# Patient Record
Sex: Female | Born: 1985 | Race: Black or African American | Hispanic: No | Marital: Single | Smoking: Never smoker
Health system: Southern US, Community
[De-identification: ages and names within clinical notes are randomized; demographics above are authoritative.]

## PROBLEM LIST (undated history)

## (undated) DIAGNOSIS — D496 Neoplasm of unspecified behavior of brain: Secondary | ICD-10-CM

## (undated) DIAGNOSIS — N6452 Nipple discharge: Secondary | ICD-10-CM

## (undated) DIAGNOSIS — R2689 Other abnormalities of gait and mobility: Secondary | ICD-10-CM

## (undated) DIAGNOSIS — N12 Tubulo-interstitial nephritis, not specified as acute or chronic: Secondary | ICD-10-CM

## (undated) DIAGNOSIS — B20 Human immunodeficiency virus [HIV] disease: Secondary | ICD-10-CM

## (undated) DIAGNOSIS — C716 Malignant neoplasm of cerebellum: Secondary | ICD-10-CM

## (undated) DIAGNOSIS — N390 Urinary tract infection, site not specified: Secondary | ICD-10-CM

## (undated) DIAGNOSIS — R260 Ataxic gait: Secondary | ICD-10-CM

## (undated) HISTORY — DX: Malignant neoplasm of cerebellum: C71.6

## (undated) HISTORY — PX: TONSILLECTOMY AND ADENOIDECTOMY: SUR1326

---

## 2010-03-23 ENCOUNTER — Emergency Department (HOSPITAL_COMMUNITY)
Admission: EM | Admit: 2010-03-23 | Discharge: 2010-03-23 | Disposition: A | Discharge status: Home / Self Care | Payer: Self-pay | Attending: Emergency Medicine | Admitting: Emergency Medicine

## 2010-03-23 DIAGNOSIS — M549 Dorsalgia, unspecified: Secondary | ICD-10-CM | POA: Insufficient documentation

## 2010-03-23 DIAGNOSIS — R1110 Vomiting, unspecified: Secondary | ICD-10-CM | POA: Insufficient documentation

## 2010-03-23 DIAGNOSIS — R51 Headache: Secondary | ICD-10-CM | POA: Insufficient documentation

## 2010-03-23 DIAGNOSIS — R109 Unspecified abdominal pain: Secondary | ICD-10-CM | POA: Insufficient documentation

## 2010-03-23 DIAGNOSIS — N390 Urinary tract infection, site not specified: Secondary | ICD-10-CM | POA: Insufficient documentation

## 2010-03-23 LAB — URINALYSIS, ROUTINE W REFLEX MICROSCOPIC
Hgb urine dipstick: NEGATIVE
Ketones, ur: NEGATIVE mg/dL
Protein, ur: NEGATIVE mg/dL
Urine Glucose, Fasting: NEGATIVE mg/dL
pH: 7.5 (ref 5.0–8.0)

## 2010-03-23 LAB — URINE MICROSCOPIC-ADD ON

## 2010-03-23 LAB — PREGNANCY, URINE: Preg Test, Ur: NEGATIVE

## 2010-03-25 LAB — URINE CULTURE: Culture  Setup Time: 201202081412

## 2010-07-09 ENCOUNTER — Emergency Department (HOSPITAL_COMMUNITY)
Admission: EM | Admit: 2010-07-09 | Discharge: 2010-07-09 | Disposition: A | Discharge status: Home / Self Care | Payer: Self-pay | Attending: Emergency Medicine | Admitting: Emergency Medicine

## 2010-07-09 ENCOUNTER — Emergency Department (HOSPITAL_COMMUNITY): Payer: Self-pay

## 2010-07-09 DIAGNOSIS — N390 Urinary tract infection, site not specified: Secondary | ICD-10-CM | POA: Insufficient documentation

## 2010-07-09 DIAGNOSIS — O2390 Unspecified genitourinary tract infection in pregnancy, unspecified trimester: Secondary | ICD-10-CM | POA: Insufficient documentation

## 2010-07-09 DIAGNOSIS — R300 Dysuria: Secondary | ICD-10-CM | POA: Insufficient documentation

## 2010-07-09 LAB — URINALYSIS, ROUTINE W REFLEX MICROSCOPIC
Nitrite: NEGATIVE
Specific Gravity, Urine: 1.026 (ref 1.005–1.030)
Urobilinogen, UA: 1 mg/dL (ref 0.0–1.0)
pH: 6.5 (ref 5.0–8.0)

## 2010-07-09 LAB — URINE MICROSCOPIC-ADD ON

## 2010-07-12 LAB — URINE CULTURE

## 2010-07-13 ENCOUNTER — Emergency Department (HOSPITAL_COMMUNITY)
Admission: EM | Admit: 2010-07-13 | Discharge: 2010-07-14 | Disposition: A | Discharge status: Home / Self Care | Payer: Self-pay | Attending: Emergency Medicine | Admitting: Emergency Medicine

## 2010-07-13 DIAGNOSIS — N898 Other specified noninflammatory disorders of vagina: Secondary | ICD-10-CM | POA: Insufficient documentation

## 2010-07-13 DIAGNOSIS — O2390 Unspecified genitourinary tract infection in pregnancy, unspecified trimester: Secondary | ICD-10-CM | POA: Insufficient documentation

## 2010-07-13 DIAGNOSIS — N72 Inflammatory disease of cervix uteri: Secondary | ICD-10-CM | POA: Insufficient documentation

## 2010-07-14 LAB — WET PREP, GENITAL: Trich, Wet Prep: NONE SEEN

## 2010-08-30 LAB — ABO/RH: RH Type: POSITIVE

## 2010-08-30 LAB — RUBELLA ANTIBODY, IGM: Rubella: IMMUNE

## 2010-08-30 LAB — HIV ANTIBODY (ROUTINE TESTING W REFLEX): HIV: NONREACTIVE

## 2010-08-30 LAB — HEPATITIS B SURFACE ANTIGEN: Hepatitis B Surface Ag: NEGATIVE

## 2010-08-31 ENCOUNTER — Encounter (HOSPITAL_COMMUNITY): Payer: Self-pay | Admitting: Obstetrics and Gynecology

## 2010-08-31 ENCOUNTER — Inpatient Hospital Stay (HOSPITAL_COMMUNITY)
Admit: 2010-08-31 | Discharge: 2010-08-31 | Disposition: A | Discharge status: Home / Self Care | Payer: Medicaid Other | Source: Ambulatory Visit | Attending: Obstetrics | Admitting: Obstetrics

## 2010-08-31 ENCOUNTER — Other Ambulatory Visit: Payer: Self-pay | Admitting: Obstetrics & Gynecology

## 2010-08-31 DIAGNOSIS — N949 Unspecified condition associated with female genital organs and menstrual cycle: Secondary | ICD-10-CM | POA: Insufficient documentation

## 2010-08-31 DIAGNOSIS — R109 Unspecified abdominal pain: Secondary | ICD-10-CM

## 2010-08-31 DIAGNOSIS — O99891 Other specified diseases and conditions complicating pregnancy: Secondary | ICD-10-CM | POA: Insufficient documentation

## 2010-08-31 DIAGNOSIS — Z8751 Personal history of pre-term labor: Secondary | ICD-10-CM

## 2010-08-31 DIAGNOSIS — O9989 Other specified diseases and conditions complicating pregnancy, childbirth and the puerperium: Secondary | ICD-10-CM

## 2010-08-31 LAB — URINALYSIS, ROUTINE W REFLEX MICROSCOPIC
Bilirubin Urine: NEGATIVE
Glucose, UA: NEGATIVE mg/dL
Hgb urine dipstick: NEGATIVE
Ketones, ur: NEGATIVE mg/dL
pH: 7 (ref 5.0–8.0)

## 2010-08-31 LAB — URINE MICROSCOPIC-ADD ON

## 2010-08-31 MED ORDER — ACETAMINOPHEN 325 MG PO TABS
650.0000 mg | ORAL_TABLET | Freq: Once | ORAL | Status: AC
  Administered 2010-08-31: 650 mg via ORAL
  Filled 2010-08-31: qty 2

## 2010-08-31 NOTE — ED Provider Notes (Signed)
History     Chief Complaint  Patient presents with  . Back Pain  . Abdominal Pain   HPI Having lower abdominal pain, vaginal pain and back pain.  Was seen in the office yesterday.  Was given Macrobid for UTI suppression, but has not filled prescription yet.  Went to work and worked 5 pm to 7 am today.  Pain has worsened today so she came for evaluation. OB History    Grav Para Term Preterm Abortions TAB SAB Ect Mult Living   3 2 1 1      2       Past Medical History  Diagnosis Date  . UTI (urinary tract infection)   . No pertinent past medical history     Past Surgical History  Procedure Date  . Tonsillectomy     Family History  Problem Relation Age of Onset  . Diabetes Mother   . Cancer Mother   . Thyroid disease Mother   . Hypertension Father   . Cancer Father   . Thyroid disease Father   . Stroke Father     History  Substance Use Topics  . Smoking status: Never Smoker   . Smokeless tobacco: Not on file  . Alcohol Use: No    Allergies:  Allergies  Allergen Reactions  . Penicillins Hives and Itching    Prescriptions prior to admission  Medication Sig Dispense Refill  . prenatal vitamin w/FE, FA (PRENATAL 1 + 1) 27-1 MG TABS Take 1 tablet by mouth daily.        . nitrofurantoin (MACRODANTIN) 100 MG capsule Take 100 mg by mouth daily.          ROS Physical Exam   Blood pressure 105/57, pulse 97, temperature 98.4 F (36.9 C), temperature source Oral, resp. rate 20, height 5\' 2"  (1.575 m), weight 152 lb (68.947 kg), last menstrual period 03/04/2010.  Physical Exam  Nursing note and vitals reviewed. Constitutional: She is oriented to person, place, and time. She appears well-developed and well-nourished.  HENT:  Head: Normocephalic.  Eyes: EOM are normal.  Neck: Neck supple.  GI: Soft. There is no tenderness. There is no rebound and no guarding.       One contraction seen on monitor strip during visit.  FHT baseline 140.  Genitourinary:   Bimanual exam - cervix long, thick,1 cm, firm - unchanged from exam in office yesterday  Musculoskeletal: Normal range of motion.  Neurological: She is alert and oriented to person, place, and time.  Skin: Skin is warm and dry.  Psychiatric: She has a normal mood and affect.    MAU Course  Procedures Results for orders placed during the hospital encounter of 08/31/10 (from the past 24 hour(s))  URINALYSIS, ROUTINE W REFLEX MICROSCOPIC     Status: Abnormal   Collection Time   08/31/10 12:14 PM      Component Value Range   Color, Urine YELLOW  YELLOW    Appearance CLEAR  CLEAR    Specific Gravity, Urine 1.025  1.005 - 1.030    pH 7.0  5.0 - 8.0    Glucose, UA NEGATIVE  NEGATIVE (mg/dL)   Hgb urine dipstick NEGATIVE  NEGATIVE    Bilirubin Urine NEGATIVE  NEGATIVE    Ketones, ur NEGATIVE  NEGATIVE (mg/dL)   Protein, ur NEGATIVE  NEGATIVE (mg/dL)   Urobilinogen, UA 0.2  0.0 - 1.0 (mg/dL)   Nitrite NEGATIVE  NEGATIVE    Leukocytes, UA TRACE (*) NEGATIVE   URINE MICROSCOPIC-ADD  ON     Status: Normal   Collection Time   08/31/10 12:14 PM      Component Value Range   Squamous Epithelial / LPF RARE  RARE    WBC, UA 7-10  <3 (WBC/hpf)   Bacteria, UA RARE  RARE    MDM Reviewed plan of care with Dr. Clearance Coots  Assessment and Plan  Abdominal pain in pregnancy No UTI No cervical change  Plan: Tylenol 325 mg 2 tablets by mouth every 4 hours if needed for pain. Drink at least 8 8-oz glasses of water every day.  Keep the scheduled appointment you have in the office.  Call your doctor if you have questions or concerns.  Get your prescription filled for the Macrobid given to you in the office and take as directed beginning today. BURLESON,TERRI 08/31/2010, 12:38 PM

## 2010-08-31 NOTE — Progress Notes (Signed)
Pt presents to MAU with complaints of back pain, abdominal pain, and vaginal pressure. 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 Pt has a history as stated by her of a pre term delivery with her last pregnancy.

## 2010-08-31 NOTE — Progress Notes (Signed)
Pt went to [facility] for first prenatal visit, later prenatal care.

## 2010-09-06 ENCOUNTER — Ambulatory Visit (HOSPITAL_COMMUNITY)
Admission: RE | Admit: 2010-09-06 | Discharge: 2010-09-06 | Disposition: A | Discharge status: Home / Self Care | Payer: Medicaid Other | Source: Ambulatory Visit | Attending: Obstetrics & Gynecology | Admitting: Obstetrics & Gynecology

## 2010-09-06 ENCOUNTER — Other Ambulatory Visit (HOSPITAL_COMMUNITY): Payer: Self-pay

## 2010-09-06 ENCOUNTER — Other Ambulatory Visit: Payer: Self-pay | Admitting: Obstetrics & Gynecology

## 2010-09-06 DIAGNOSIS — Z1389 Encounter for screening for other disorder: Secondary | ICD-10-CM | POA: Insufficient documentation

## 2010-09-06 DIAGNOSIS — Z8751 Personal history of pre-term labor: Secondary | ICD-10-CM | POA: Insufficient documentation

## 2010-09-06 DIAGNOSIS — O26879 Cervical shortening, unspecified trimester: Secondary | ICD-10-CM | POA: Insufficient documentation

## 2010-09-06 DIAGNOSIS — Z363 Encounter for antenatal screening for malformations: Secondary | ICD-10-CM | POA: Insufficient documentation

## 2010-09-06 DIAGNOSIS — O358XX0 Maternal care for other (suspected) fetal abnormality and damage, not applicable or unspecified: Secondary | ICD-10-CM | POA: Insufficient documentation

## 2010-09-06 DIAGNOSIS — O2690 Pregnancy related conditions, unspecified, unspecified trimester: Secondary | ICD-10-CM

## 2010-09-11 ENCOUNTER — Inpatient Hospital Stay (HOSPITAL_COMMUNITY)
Admission: AD | Admit: 2010-09-11 | Discharge: 2010-09-19 | DRG: 778 | Disposition: A | Discharge status: Home / Self Care | Payer: Medicaid Other | Source: Ambulatory Visit | Attending: Obstetrics & Gynecology | Admitting: Obstetrics & Gynecology

## 2010-09-11 ENCOUNTER — Encounter (HOSPITAL_COMMUNITY): Payer: Self-pay | Admitting: *Deleted

## 2010-09-11 DIAGNOSIS — R1032 Left lower quadrant pain: Secondary | ICD-10-CM | POA: Diagnosis present

## 2010-09-11 DIAGNOSIS — A5400 Gonococcal infection of lower genitourinary tract, unspecified: Secondary | ICD-10-CM | POA: Diagnosis present

## 2010-09-11 DIAGNOSIS — R1031 Right lower quadrant pain: Secondary | ICD-10-CM | POA: Diagnosis present

## 2010-09-11 DIAGNOSIS — O98219 Gonorrhea complicating pregnancy, unspecified trimester: Secondary | ICD-10-CM | POA: Diagnosis present

## 2010-09-11 DIAGNOSIS — O4700 False labor before 37 completed weeks of gestation, unspecified trimester: Principal | ICD-10-CM | POA: Diagnosis present

## 2010-09-11 DIAGNOSIS — O09219 Supervision of pregnancy with history of pre-term labor, unspecified trimester: Secondary | ICD-10-CM

## 2010-09-11 DIAGNOSIS — O479 False labor, unspecified: Secondary | ICD-10-CM

## 2010-09-11 HISTORY — DX: Tubulo-interstitial nephritis, not specified as acute or chronic: N12

## 2010-09-11 LAB — URINALYSIS, ROUTINE W REFLEX MICROSCOPIC
Ketones, ur: NEGATIVE mg/dL
Nitrite: NEGATIVE
Specific Gravity, Urine: 1.015 (ref 1.005–1.030)
pH: 7.5 (ref 5.0–8.0)

## 2010-09-11 LAB — WET PREP, GENITAL: Yeast Wet Prep HPF POC: NONE SEEN

## 2010-09-11 LAB — URINE MICROSCOPIC-ADD ON

## 2010-09-11 MED ORDER — NITROFURANTOIN MONOHYD MACRO 100 MG PO CAPS
100.0000 mg | ORAL_CAPSULE | Freq: Every day | ORAL | Status: DC
  Administered 2010-09-11 – 2010-09-18 (×8): 100 mg via ORAL
  Filled 2010-09-11 (×8): qty 1

## 2010-09-11 MED ORDER — ACETAMINOPHEN 325 MG PO TABS
650.0000 mg | ORAL_TABLET | ORAL | Status: DC | PRN
  Administered 2010-09-15 – 2010-09-17 (×2): 650 mg via ORAL
  Filled 2010-09-11 (×2): qty 2

## 2010-09-11 MED ORDER — CALCIUM CARBONATE ANTACID 500 MG PO CHEW: 2.0000 | CHEWABLE_TABLET | ORAL | Status: DC | PRN

## 2010-09-11 MED ORDER — COMPLETENATE 29-1 MG PO CHEW
1.0000 | CHEWABLE_TABLET | Freq: Every day | ORAL | Status: DC
  Administered 2010-09-11 – 2010-09-18 (×8): 1 via ORAL
  Filled 2010-09-11 (×10): qty 1

## 2010-09-11 MED ORDER — HYDROXYPROGESTERONE CAPROATE 250 MG/ML IM OIL
250.0000 mg | TOPICAL_OIL | INTRAMUSCULAR | Status: DC
  Administered 2010-09-11 – 2010-09-18 (×2): 250 mg via INTRAMUSCULAR
  Filled 2010-09-11 (×2): qty 1

## 2010-09-11 MED ORDER — NIFEDIPINE 10 MG PO CAPS
20.0000 mg | ORAL_CAPSULE | Freq: Once | ORAL | Status: AC
  Administered 2010-09-11: 20 mg via ORAL
  Filled 2010-09-11: qty 2

## 2010-09-11 MED ORDER — BETAMETHASONE SOD PHOS & ACET 6 (3-3) MG/ML IJ SUSP
12.0000 mg | INTRAMUSCULAR | Status: AC
  Administered 2010-09-11 – 2010-09-12 (×2): 12 mg via INTRAMUSCULAR
  Filled 2010-09-11 (×2): qty 2

## 2010-09-11 MED ORDER — ZOLPIDEM TARTRATE 10 MG PO TABS: 10.0000 mg | ORAL_TABLET | Freq: Every evening | ORAL | Status: DC | PRN

## 2010-09-11 MED ORDER — DOCUSATE SODIUM 100 MG PO CAPS
100.0000 mg | ORAL_CAPSULE | Freq: Every day | ORAL | Status: DC
  Administered 2010-09-11 – 2010-09-18 (×8): 100 mg via ORAL
  Filled 2010-09-11 (×11): qty 1

## 2010-09-11 MED ORDER — COMPLETENATE 29-1 MG PO CHEW: 1.0000 | CHEWABLE_TABLET | Freq: Every day | ORAL | Status: DC

## 2010-09-11 MED ORDER — NIFEDIPINE 10 MG PO CAPS
10.0000 mg | ORAL_CAPSULE | Freq: Four times a day (QID) | ORAL | Status: DC
  Administered 2010-09-11 – 2010-09-15 (×16): 10 mg via ORAL
  Filled 2010-09-11 (×16): qty 1

## 2010-09-11 MED ORDER — NITROFURANTOIN MACROCRYSTAL 100 MG PO CAPS: 100.0000 mg | ORAL_CAPSULE | Freq: Every day | ORAL | Status: DC

## 2010-09-11 MED ORDER — PRENATAL PLUS 27-1 MG PO TABS
1.0000 | ORAL_TABLET | Freq: Every day | ORAL | Status: DC
  Filled 2010-09-11: qty 1

## 2010-09-11 NOTE — Progress Notes (Signed)
By Artelia Laroche CNM

## 2010-09-11 NOTE — Progress Notes (Signed)
Report given to Antenatal charge RN.  To room 152 via w/c

## 2010-09-11 NOTE — Progress Notes (Signed)
Instructions on how to turn alert on when monitoring fetal heart rate for 30 minutes given to Ether Griffins, Charity fundraiser (pt's nurse). Nurse verbalized understanding.

## 2010-09-11 NOTE — Progress Notes (Addendum)
1636: New admit;  Per MD order, Fetal monitoring 30 minutes per shift with continuous toco.  Fetal alert turned off; while toco remains in place.

## 2010-09-11 NOTE — H&P (Signed)
History       Chief Complaint   Patient presents with   .  Abdominal Pain   .  Vaginal Discharge    Patient is a 25 y.o. female presenting with abdominal pain and vaginal discharge. The history is provided by the patient.  Abdominal Pain The primary symptoms of the illness include abdominal pain and vaginal discharge. The primary symptoms of the illness do not include fever. The current episode started 13 to 24 hours ago. The problem has not changed since onset. The abdominal pain began 13 to24 hours ago. The abdominal pain is located in the LLQ and RLQ. The abdominal pain does not radiate.  Vaginal Discharge Associated symptoms include abdominal pain. Pertinent negatives include no fever.        Past Medical History   Diagnosis  Date   .  UTI (urinary tract infection)     .  Preterm delivery     .  Pyelonephritis         Past Surgical History   Procedure  Date   .  Tonsillectomy         Family History   Problem  Relation  Age of Onset   .  Diabetes  Mother     .  Cancer  Mother     .  Thyroid disease  Mother     .  Hypertension  Father     .  Cancer  Father     .  Thyroid disease  Father     .  Stroke  Father         History   Substance Use Topics   .  Smoking status:  Never Smoker    .  Smokeless tobacco:  Never Used   .  Alcohol Use:  No     Allergies:  Allergies   Allergen  Reactions   .  Penicillins  Hives and Itching       Prescriptions prior to admission   Medication  Sig  Dispense  Refill   .  nitrofurantoin (MACRODANTIN) 100 MG capsule  Take 100 mg by mouth daily.          .  prenatal vitamin w/FE, FA (PRENATAL 1 + 1) 27-1 MG TABS  Take 1 tablet by mouth daily.            Review of Systems  Constitutional: Negative for fever.  Gastrointestinal: Positive for abdominal pain.  Genitourinary: Positive for vaginal discharge.   Physical Exam    Blood pressure 116/81, pulse 96, temperature 98.6 F (37 C), temperature source Oral, resp. rate 18,  height 5\' 2"  (1.575 m), weight 152 lb 6.4 oz (69.128 kg), last menstrual period 03/04/2010.  Physical Exam  Constitutional: She is oriented to person, place, and time. She appears well-developed and well-nourished.  HENT:   Head: Normocephalic.  Neck: Normal range of motion.  Cardiovascular: Normal rate.   Respiratory: Effort normal.  GI: Soft.  Genitourinary: Vagina normal and uterus normal. Vaginal discharge: Speculum: no bleeding, GC/Chlamydia done.  Musculoskeletal: Normal range of motion.  Neurological: She is alert and oriented to person, place, and time.  Skin: Skin is warm and dry.  Psychiatric: She has a normal mood and affect.      MAU Course   Procedures     Assessment and Plan   A: IUP at 27+ weeks Preterm contractions with premature cervical effacement History of Preterm Delivery  COnsulted Dr Tamela Oddi Will admit for Procardia, Betamethasone series.  No need to do FFN    [facility] 09/11/2010, 2:59 PM  Cosigned by: Roseanna Rainbow, MD    [09/11/2010 9:52 PM]

## 2010-09-11 NOTE — ED Provider Notes (Signed)
History     Chief Complaint  Patient presents with  . Abdominal Pain  . Vaginal Discharge   Patient is a 25 y.o. female presenting with abdominal pain and vaginal discharge. The history is provided by the patient.  Abdominal Pain The primary symptoms of the illness include abdominal pain and vaginal discharge. The primary symptoms of the illness do not include fever. The current episode started 13 to 24 hours ago. The problem has not changed since onset. The abdominal pain began 13 to24 hours ago. The abdominal pain is located in the LLQ and RLQ. The abdominal pain does not radiate.  Vaginal Discharge Associated symptoms include abdominal pain. Pertinent negatives include no fever.      Past Medical History  Diagnosis Date  . UTI (urinary tract infection)   . Preterm delivery   . Pyelonephritis     Past Surgical History  Procedure Date  . Tonsillectomy     Family History  Problem Relation Age of Onset  . Diabetes Mother   . Cancer Mother   . Thyroid disease Mother   . Hypertension Father   . Cancer Father   . Thyroid disease Father   . Stroke Father     History  Substance Use Topics  . Smoking status: Never Smoker   . Smokeless tobacco: Never Used  . Alcohol Use: No    Allergies:  Allergies  Allergen Reactions  . Penicillins Hives and Itching    Prescriptions prior to admission  Medication Sig Dispense Refill  . nitrofurantoin (MACRODANTIN) 100 MG capsule Take 100 mg by mouth daily.       . prenatal vitamin w/FE, FA (PRENATAL 1 + 1) 27-1 MG TABS Take 1 tablet by mouth daily.         Review of Systems  Constitutional: Negative for fever.  Gastrointestinal: Positive for abdominal pain.  Genitourinary: Positive for vaginal discharge.   Physical Exam   Blood pressure 116/81, pulse 96, temperature 98.6 F (37 C), temperature source Oral, resp. rate 18, height 5\' 2"  (1.575 m), weight 152 lb 6.4 oz (69.128 kg), last menstrual period 03/04/2010.  Physical  Exam  Constitutional: She is oriented to person, place, and time. She appears well-developed and well-nourished.  HENT:  Head: Normocephalic.  Neck: Normal range of motion.  Cardiovascular: Normal rate.   Respiratory: Effort normal.  GI: Soft.  Genitourinary: Vagina normal and uterus normal. Vaginal discharge: Speculum: no bleeding, GC/Chlamydia done.  Musculoskeletal: Normal range of motion.  Neurological: She is alert and oriented to person, place, and time.  Skin: Skin is warm and dry.  Psychiatric: She has a normal mood and affect.    MAU Course  Procedures   Assessment and Plan  A: IUP at 27+ weeks Preterm contractions with premature cervical effacement History of Preterm Delivery  COnsulted Dr Tamela Oddi Will admit for Procardia, Betamethasone series.  No need to do FFN    [facility] 09/11/2010, 2:59 PM

## 2010-09-11 NOTE — ED Provider Notes (Signed)
History     Chief Complaint  Patient presents with  . Abdominal Pain  . Vaginal Discharge   Abdominal Pain The current episode started 13 to 24 hours ago. The problem has not changed since onset.The abdominal pain does not radiate. Pertinent negatives include no fever.  Vaginal Discharge The patient's primary symptoms include a vaginal discharge. Associated symptoms include abdominal pain. Pertinent negatives include no fever.      Past Medical History  Diagnosis Date  . UTI (urinary tract infection)   . Preterm delivery   . Pyelonephritis     Past Surgical History  Procedure Date  . Tonsillectomy     Family History  Problem Relation Age of Onset  . Diabetes Mother   . Cancer Mother   . Thyroid disease Mother   . Hypertension Father   . Cancer Father   . Thyroid disease Father   . Stroke Father     History  Substance Use Topics  . Smoking status: Never Smoker   . Smokeless tobacco: Never Used  . Alcohol Use: No    Allergies:  Allergies  Allergen Reactions  . Penicillins Hives and Itching    Prescriptions prior to admission  Medication Sig Dispense Refill  . nitrofurantoin (MACRODANTIN) 100 MG capsule Take 100 mg by mouth daily.       . prenatal vitamin w/FE, FA (PRENATAL 1 + 1) 27-1 MG TABS Take 1 tablet by mouth daily.         Review of Systems  Constitutional: Negative for fever.  Gastrointestinal: Positive for abdominal pain.  Genitourinary: Positive for vaginal discharge.   Physical Exam   Blood pressure 103/71, pulse 106, temperature 98.1 F (36.7 C), temperature source Oral, resp. rate 20, height 5\' 2"  (1.575 m), weight 69.128 kg (152 lb 6.4 oz), last menstrual period 03/04/2010.  Physical Exam  Constitutional: She is oriented to person, place, and time. She appears well-developed and well-nourished.  HENT:  Head: Normocephalic.  Neck: Normal range of motion.  Cardiovascular: Normal rate.   Respiratory: Effort normal.  GI: Soft.    Genitourinary: Vagina normal and uterus normal. Vaginal discharge: Speculum: no bleeding, GC/Chlamydia done.  Musculoskeletal: Normal range of motion.  Neurological: She is alert and oriented to person, place, and time.  Skin: Skin is warm and dry.  Psychiatric: She has a normal mood and affect.    MAU Course  Procedures    Assessment and Plan  A: IUP at 27+ weeks Preterm contractions with premature cervical effacement History of Preterm Delivery  COnsulted Dr Tamela Oddi Will admit for Procardia, Betamethasone series.  No need to do FFN    JACKSON-MOORE,Eilam [place] A 09/11/2010, 9:52 PM

## 2010-09-11 NOTE — Progress Notes (Signed)
Pt reports being told her cervix is dilating from her Doctor 1.6-1.8. Had u/s on Tues and told she had dilated further and the doctor would give her some medicne to stop cts on her next appointment . Next appointment is not until 8/6. Pt reports having sharp abd pain off and on. Reports good fetal movment. Report having white milky vaginal discharge.

## 2010-09-12 NOTE — Progress Notes (Signed)
  S: Preterm labor symptoms: Cramping.  O: Blood pressure 93/46, pulse 99, temperature 98.2 F (36.8 C), temperature source Oral, resp. rate 18, height 5\' 2"  (1.575 m), weight 69.128 kg (152 lb 6.4 oz), last menstrual period 03/04/2010.   EAV:WUJWJXBJ: 150 bpm Toco: Date/time of onset: yesterday, Frequency: 3 times per hour and Intensity: mild YNW:GNFAOZHY: 1 Effacement (%): 60 Station: -3 Presentation: Vertex  A/P- 25 y.o. admitted with Cramping. Preterm labor management: IV D5LR started Dating:  [redacted]w[redacted]d PNL Needed:  None FWB:  Good PTL:  None ROD: spontaneous vaginal

## 2010-09-13 MED ORDER — CEFTRIAXONE SODIUM 250 MG IJ SOLR
250.0000 mg | Freq: Once | INTRAMUSCULAR | Status: AC
  Administered 2010-09-13: 250 mg via INTRAMUSCULAR
  Filled 2010-09-13 (×2): qty 250

## 2010-09-13 MED ORDER — AZITHROMYCIN 1 G PO PACK
1.0000 g | PACK | Freq: Once | ORAL | Status: AC
  Administered 2010-09-13: 1 g via ORAL
  Filled 2010-09-13: qty 1

## 2010-09-13 NOTE — Progress Notes (Signed)
UR Chart review completed.  

## 2010-09-13 NOTE — Progress Notes (Signed)
  S: Preterm labor symptoms: pelvic pressure and cramping.  O: Blood pressure 88/48, pulse 97, temperature 97.2 F (36.2 C), temperature source Oral, resp. rate 20, height 5\' 2"  (1.575 m), weight 69.128 kg (152 lb 6.4 oz), last menstrual period 03/04/2010.   WUJ:WJXBJYNW: 150 bpm, Variability: Good {> 6 bpm) and Accelerations: Reactive Toco: None GNF:AOZHYQMV: 1 Effacement (%): 60 Station: -3 Presentation: Vertex  A/P- 25 y.o. admitted with preterm labor  Preterm labor management: bedrest advised and pelvic rest advised Dating:  [redacted]w[redacted]d PNL Needed:  none FWB:  good PTL:  none ROD: spontaneous vaginal

## 2010-09-13 NOTE — Progress Notes (Signed)
  S: Preterm labor symptoms: pelvic pressure and cramping.  O: Blood pressure 109/66, pulse 109, temperature 97.8 F (36.6 C), temperature source Oral, resp. rate 18, height 5\' 2"  (1.575 m), weight 69.128 kg (152 lb 6.4 oz), last menstrual period 03/04/2010.   WJX:BJYNWGNF: 150 bpm Toco: None AOZ:HYQMVHQI: 1 Effacement (%): 60 Station: -3 Presentation: Vertex  A/P- 25 y.o. admitted with preterm labor  Preterm labor management: bedrest advised and pelvic rest advised Dating:  [redacted]w[redacted]d PNL Needed:   GC CULTURE - POSITIVE.  Treated :  Rocephin 250mg  IM and Azithromycin 1000mg  po. FWB:  good PTL:  no ROD: spontaneous vaginal

## 2010-09-14 ENCOUNTER — Inpatient Hospital Stay (HOSPITAL_COMMUNITY): Payer: Medicaid Other

## 2010-09-14 NOTE — Progress Notes (Signed)
  S: Preterm labor symptoms: pelvic pressure and cramping.  O: Blood pressure 96/57, pulse 80, temperature 97.9 F (36.6 C), temperature source Oral, resp. rate 18, height 5\' 2"  (1.575 m), weight 69.128 kg (152 lb 6.4 oz), last menstrual period 03/04/2010.   OZH:YQMVHQIO: 150 bpm, Variability: Good {> 6 bpm) and Accelerations: Reactive Toco: None NGE:XBMWUXLK: 1 Effacement (%): 60 Station: -3 Presentation: Vertex  A/P- 25 y.o. admitted with preterm labor  Preterm labor management: bedrest advised and pelvic rest advised Dating:  [redacted]w[redacted]d PNL Needed:  none FWB:  good PTL:  none ROD: spontaneous vaginal

## 2010-09-15 MED ORDER — HYDROMORPHONE HCL 2 MG PO TABS
2.0000 mg | ORAL_TABLET | Freq: Four times a day (QID) | ORAL | Status: DC | PRN
  Administered 2010-09-15 (×2): 2 mg via ORAL
  Filled 2010-09-15 (×2): qty 1

## 2010-09-15 NOTE — Progress Notes (Signed)
  S: Preterm labor symptoms: pelvic pressure  O: Blood pressure 101/66, pulse 109, temperature 98.4 F (36.9 C), temperature source Oral, resp. rate 20, height 5\' 2"  (1.575 m), weight 69.446 kg (153 lb 1.6 oz), last menstrual period 03/04/2010.   MWU:XLKGMWNU: 150 bpm Toco: None UVO:ZDGUYQIH: 1 Effacement (%): 60 Station: -3 Presentation: Vertex  A/P- 25 y.o. admitted with preterm labor  Preterm labor management: bedrest advised, pelvic rest advised and Weekly 17 OH - P Dating:  [redacted]w[redacted]d PNL Needed:  none FWB:  good PTL:  none ROD: spontaneous vaginal

## 2010-09-16 MED ORDER — PROCHLORPERAZINE EDISYLATE 5 MG/ML IJ SOLN
10.0000 mg | Freq: Once | INTRAMUSCULAR | Status: DC
  Filled 2010-09-16: qty 2

## 2010-09-16 MED ORDER — MORPHINE SULFATE 10 MG/ML IJ SOLN
10.0000 mg | Freq: Once | INTRAMUSCULAR | Status: AC
  Administered 2010-09-16: 10 mg via INTRAMUSCULAR
  Filled 2010-09-16: qty 1

## 2010-09-16 MED ORDER — TERBUTALINE SULFATE 1 MG/ML IJ SOLN
INTRAMUSCULAR | Status: AC
  Filled 2010-09-16: qty 1

## 2010-09-16 MED ORDER — DEXAMETHASONE SODIUM PHOSPHATE 10 MG/ML IJ SOLN
10.0000 mg | Freq: Once | INTRAMUSCULAR | Status: AC
  Administered 2010-09-16: 10 mg via INTRAVENOUS
  Filled 2010-09-16: qty 1

## 2010-09-16 MED ORDER — PROMETHAZINE HCL 25 MG/ML IJ SOLN
25.0000 mg | Freq: Once | INTRAMUSCULAR | Status: AC
  Administered 2010-09-16: 25 mg via INTRAMUSCULAR
  Filled 2010-09-16: qty 1

## 2010-09-16 MED ORDER — LACTATED RINGERS IV SOLN
Freq: Once | INTRAVENOUS | Status: AC
  Administered 2010-09-16: 03:00:00 via INTRAVENOUS

## 2010-09-16 MED ORDER — DIPHENHYDRAMINE HCL 50 MG/ML IJ SOLN
25.0000 mg | Freq: Once | INTRAMUSCULAR | Status: AC
  Administered 2010-09-16: 25 mg via INTRAVENOUS
  Filled 2010-09-16: qty 1

## 2010-09-16 MED ORDER — SODIUM CHLORIDE 0.9 % IJ SOLN
3.0000 mL | Freq: Two times a day (BID) | INTRAMUSCULAR | Status: DC
  Administered 2010-09-16: 3 mL via INTRAVENOUS

## 2010-09-16 NOTE — Progress Notes (Signed)
UR Chart review completed.  

## 2010-09-16 NOTE — Progress Notes (Signed)
  S: Preterm labor symptoms: none  O: Blood pressure 122/80, pulse 107, temperature 98.2 F (36.8 C), temperature source Oral, resp. rate 18, height 5\' 2"  (1.575 m), weight 69.446 kg (153 lb 1.6 oz), last menstrual period 03/04/2010.   RUE:AVWUJWJX: 150 bpm Toco: None BJY:NWGNFAOZ: 1 Effacement (%): 60 Station: -3 Presentation: Vertex  A/P- 25 y.o. admitted with preterm labor  Preterm labor management: bedrest advised, pelvic rest advised and Weekly 17 OH P. Dating:  [redacted]w[redacted]d PNL Needed:  none FWB:  good PTL:  none ROD: spontaneous vaginal

## 2010-09-16 NOTE — Progress Notes (Signed)
Charted in the wrong column

## 2010-09-17 NOTE — Progress Notes (Signed)
RN to the bedside, technical difficulties with monitor - toco in continuous tracing mode.

## 2010-09-18 NOTE — Progress Notes (Signed)
  Bile signs normal The bleeding  No contractions No complaints

## 2010-09-19 MED ORDER — HYDROXYPROGESTERONE CAPROATE 250 MG/ML IM OIL: 250.0000 mg | TOPICAL_OIL | INTRAMUSCULAR | Status: DC

## 2010-09-19 NOTE — Discharge Summary (Signed)
Physician Discharge Summary  Patient ID: Jodi Morrow MRN: [id number] DOB/AGE: 1985-02-15 24 y.o.  Admit date: 09/11/2010 Discharge date: 09/19/2010  Admission Diagnoses:  Discharge Diagnoses:  Active Problems:  Threatened premature labor   Discharged Condition: good  Hospital Course: normal  Consults: MFM  Significant Diagnostic Studies: ultrasound  Treatments: IV hydration, antibiotics: ceftriaxone, steroids: betamethasone and bedrest  Discharge Exam: Blood pressure 105/72, pulse 113, temperature 98 F (36.7 C), temperature source Oral, resp. rate 20, height 5\' 2"  (1.575 m), weight 69.446 kg (153 lb 1.6 oz), last menstrual period 03/04/2010. Pelvic: deferred  Disposition: Home or Self Care  Discharge Orders    Future Appointments: Provider: Department: Dept Phone: Center:   09/20/2010 2:30 PM [facility] [place] [facility] [phone] [facility]     Future Orders Please Complete By Expires   PRETERM LABOR:  Includes any of the follwing symptoms that occur between 20 - [redacted] weeks gestation.  If these symptoms are not stopped, preterm labor can result in preterm delivery, placing your baby at risk      Notify physician for menstrual like cramps      Notify physician for uterine contractions.  These may be painless and feel like the uterus is tightening or the baby is  "balling up"      Notify physician for low, dull backache, unrelieved by heat or Tylenol      Notify physician for intestinal cramps, with or without diarrhea, sometimes described as "gas pain"      Notify physician for pelvic pressure      Notify physician for increase or change in vaginal discharge      Notify physician for vaginal bleeding      Notify physician for a general feeling that "something is not right"      Notify physician for leaking of fluid      Discharge instructions      Comments:   Routine.   LABOR:  When conractions begin, you should start to time them from the beginning of one contraction to  the beginning  of the next.  When contractions are 5 - 10 minutes apart or less and have been regular for at least an hour, you should call your health care provider.      Notify physician for bleeding from the vagina      Notify physician for pain or burning when urinating      Notify physician for chills or fever      Notify physician for increase in vaginal discharge      Notify physician for pelvic pressure (sudden increase)      Notify physician if baby moving less than usual      Notify physician for sudden, constant, or occasional abdominal pain      Notify physician for sudden gushing of fluid from the vagina (with or without continued leaking)      Notify physician for leaking of fluid      Notify physician for fainting spells, "black outs" or loss of consciousness      Notify physician for severe or continued nausea or vomiting      Notify physician for blurring of vision or spots before the eyes      Fetal Kick Count:  Lie on our left side for one hour after a meal, and count the number of times your baby kicks.  If it is less than 5 times, get up, move around and drink some juice.  Repeat the test 30 minutes later.  If it is still less than 5 kicks in an hour, notify your doctor.      Discharge activity: Bedrest      Discharge activity:  Up to eat      Discharge activity:  Bathroom / Shower only      Do not have sex or do anything that might make you have an orgasm      Discharge diet:  No restrictions        Current Discharge Medication List    START taking these medications   Details  hydroxyprogesterone caproate (DELALUTIN) 250 mg/mL OIL Inject 1 mL (250 mg total) into the muscle every 7 (seven) days. Qty: 0.98 mL, Refills: 7      CONTINUE these medications which have NOT CHANGED   Details  nitrofurantoin (MACRODANTIN) 100 MG capsule Take 100 mg by mouth daily.     prenatal vitamin w/FE, FA (PRENATAL 1 + 1) 27-1 MG TABS Take 1 tablet by mouth daily.         Follow-up Information    Follow up with Panhia Karl A, MD. Make an appointment in 1 week. (17 OH - P injection weekly.)    Contact information:   [address] [place] [address] [phone]          Signed: Brock Bad 09/19/2010, 9:43 AM

## 2010-09-19 NOTE — Progress Notes (Signed)
  S: Preterm labor symptoms: pelvic pressure  O: Blood pressure 105/72, pulse 113, temperature 98 F (36.7 C), temperature source Oral, resp. rate 20, height 5\' 2"  (1.575 m), weight 69.446 kg (153 lb 1.6 oz), last menstrual period 03/04/2010.   ZOX:WRUEAVWU: 150 bpm Toco: None JWJ:XBJYNWGN: 1 Effacement (%): 60 Station: -3 Presentation: Vertex  A/P- 25 y.o. admitted with preterm labor  Preterm labor management: bedrest advised, pelvic rest advised, modified bedrest advised, discontinue work, preterm labor education provided and return to office weekly for follow-up Dating:  [redacted]w[redacted]d PNL Needed:  none FWB:  good PTL:  none ROD: spontaneous vaginal

## 2010-09-20 ENCOUNTER — Ambulatory Visit (HOSPITAL_COMMUNITY)
Admission: RE | Admit: 2010-09-20 | Discharge: 2010-09-20 | Disposition: A | Discharge status: Home / Self Care | Payer: Medicaid Other | Source: Ambulatory Visit | Attending: Obstetrics & Gynecology | Admitting: Obstetrics & Gynecology

## 2010-09-20 DIAGNOSIS — Z8751 Personal history of pre-term labor: Secondary | ICD-10-CM | POA: Insufficient documentation

## 2010-09-20 DIAGNOSIS — O2690 Pregnancy related conditions, unspecified, unspecified trimester: Secondary | ICD-10-CM

## 2010-09-20 DIAGNOSIS — O26879 Cervical shortening, unspecified trimester: Secondary | ICD-10-CM | POA: Insufficient documentation

## 2010-09-20 NOTE — Progress Notes (Signed)
Patient seen for ultrasound only appointment today.  Please see [facility] report for details.  

## 2010-09-25 NOTE — Consult Note (Signed)
NAMESHANEIL, YAZDI                ACCOUNT NO.:  [id number]  MEDICAL RECORD NO.:  [id number]  LOCATION:  9152                          FACILITY:  [facility]  PHYSICIAN:  Makel Mcmann A. Clearance Coots, M.D.DATE OF BIRTH:  [birth year]  DATE OF CONSULTATION:  09/11/2010 DATE OF DISCHARGE:  09/19/2010                                CONSULTATION   REASON FOR THE ENCOUNTER:  Preterm uterine contractions, previous preterm delivery, antepartum, preterm cervical shortening.  HISTORY:  A 25 year old female presenting with abdominal pain and vaginal discharge.  History was provided by the patient.  The symptoms started 13-24 hours prior to presentation.  The abdominal pain is located in the left lower quadrant and right lower quadrant.  Associated symptoms include vaginal discharge.  PHYSICAL EXAMINATION:  VITAL SIGNS:  The patient is afebrile.  Vital signs were stable. ABDOMEN:  Soft, nontender. PELVIC:  Revealed uterus and vagina to be normal.  Vaginal discharge was present on speculum examination.  No bleeding was present.  GC and Chlamydia cultures were done.  ASSESSMENT/PLAN:  Intrauterine pregnancy at 27+ weeks, preterm contractions with premature cervical effacement, history of preterm delivery.  Plan is to admit for tocolysis and steroid therapy and bedrest.  We will get maternal fetal medicine consultation.     Ronae Noell A. Clearance Coots, M.D.     CAH/MEDQ  D:  09/25/2010  T:  09/25/2010  Job:  [id number]

## 2010-11-02 ENCOUNTER — Telehealth: Payer: Self-pay

## 2010-11-02 LAB — ABO/RH

## 2010-11-02 LAB — ANTIBODY SCREEN: Antibody Screen: NEGATIVE

## 2010-11-02 NOTE — Telephone Encounter (Signed)
Jodi Morrow. From DIS called c/o [redacted] week pregnant patient tested positive for HIV  on 11-01-10. She is in care with [facility] clinic.  [facility] requesting appointment today.   I spoke with Dr Ninetta Lights since there are no available appointments today or tomorrow.  Dr Ninetta Lights has agreed to see pt as work in on Friday , November 04, 2010.    I will obtain medical records from [facility].     Health Dept states pt is on house arrest but should be able to make appointment.  Laurell Josephs, IV   [facility] will be faxing records today.

## 2010-11-04 ENCOUNTER — Ambulatory Visit (INDEPENDENT_AMBULATORY_CARE_PROVIDER_SITE_OTHER): Payer: Medicaid Other | Admitting: Infectious Diseases

## 2010-11-04 ENCOUNTER — Other Ambulatory Visit: Payer: Self-pay | Admitting: Infectious Diseases

## 2010-11-04 ENCOUNTER — Encounter: Payer: Self-pay | Admitting: Infectious Diseases

## 2010-11-04 VITALS — BP 121/79 | HR 102 | Temp 98.0°F | Ht 63.0 in | Wt 157.0 lb

## 2010-11-04 DIAGNOSIS — B20 Human immunodeficiency virus [HIV] disease: Secondary | ICD-10-CM

## 2010-11-04 DIAGNOSIS — Z23 Encounter for immunization: Secondary | ICD-10-CM

## 2010-11-04 DIAGNOSIS — Z113 Encounter for screening for infections with a predominantly sexual mode of transmission: Secondary | ICD-10-CM

## 2010-11-04 DIAGNOSIS — Z79899 Other long term (current) drug therapy: Secondary | ICD-10-CM

## 2010-11-04 LAB — LIPID PANEL
HDL: 57 mg/dL (ref >39)
Total CHOL/HDL Ratio: 4.4 Ratio

## 2010-11-04 LAB — COMPREHENSIVE METABOLIC PANEL
AST: 19 U/L (ref 0–37)
BUN: 8 mg/dL (ref 6–23)
Calcium: 8.9 mg/dL (ref 8.4–10.5)
Chloride: 102 mEq/L (ref 96–112)
Creat: 0.53 mg/dL (ref 0.50–1.10)

## 2010-11-04 LAB — CBC WITH DIFFERENTIAL/PLATELET
Basophils Absolute: 0 10*3/uL (ref 0.0–0.1)
Basophils Relative: 1 % (ref 0–1)
Eosinophils Absolute: 0.2 10*3/uL (ref 0.0–0.7)
Eosinophils Relative: 4 % (ref 0–5)
Lymphocytes Relative: 34 % (ref 12–46)
MCH: 28.1 pg (ref 26.0–34.0)
MCHC: 32.1 g/dL (ref 30.0–36.0)
MCV: 87.4 fL (ref 78.0–100.0)
Platelets: 184 10*3/uL (ref 150–400)
RDW: 13.7 % (ref 11.5–15.5)
WBC: 6.1 10*3/uL (ref 4.0–10.5)

## 2010-11-04 MED ORDER — PRENATAL PLUS 27-1 MG PO TABS: 1.0000 | ORAL_TABLET | Freq: Every day | ORAL | Status: DC

## 2010-11-04 MED ORDER — LAMIVUDINE-ZIDOVUDINE 150-300 MG PO TABS: 1.0000 | ORAL_TABLET | Freq: Two times a day (BID) | ORAL | Status: DC

## 2010-11-04 MED ORDER — LOPINAVIR-RITONAVIR 200-50 MG PO TABS: 3.0000 | ORAL_TABLET | Freq: Two times a day (BID) | ORAL | Status: DC

## 2010-11-04 NOTE — Progress Notes (Signed)
  Subjective:    Patient ID: Jodi Morrow, female    DOB: 1986/02/12, 25 y.o.   MRN: [id number]  HPI 25 yo G3P2 who is at 8 and 5/[redacted] weeks pregnant. She was found to have HIV+ at her last OB visit. Feels fine, has just been tired. She believes her fiance cheated on her. He has since disclosed his status to her, stating that he had only known for a week. Had previous HIV- on September 05, 2010. Has had Chlamydia before, treated at [facility] hospital (July 2012).  Toxo (-), CMV IgG+, RPR (-), Rubella +.  HIV RNA 98,200,    Review of Systems  Constitutional: Negative for fever, chills and unexpected weight change.  Respiratory: Negative for cough and shortness of breath.   Gastrointestinal: Negative for diarrhea and constipation.  Genitourinary: Negative for dysuria and difficulty urinating.  Hematological: Negative for adenopathy. Does not bruise/bleed easily.       Objective:   Physical Exam  Constitutional: She appears well-developed and well-nourished.  Eyes: EOM are normal. Pupils are equal, round, and reactive to light.  Neck: Neck supple.  Cardiovascular: Normal rate, regular rhythm and normal heart sounds.   Pulmonary/Chest: Effort normal and breath sounds normal.  Abdominal: Soft. Bowel sounds are normal. She exhibits distension. There is no tenderness.       appropriately gravid  Musculoskeletal: She exhibits no edema.  Lymphadenopathy:    She has no cervical adenopathy.          Assessment & Plan:

## 2010-11-04 NOTE — Assessment & Plan Note (Addendum)
She appears to have been infected during her pregnancy. Will start her on ART today (KLT at increased dose and CBV)and send her for labs including genotype testing. I have cautioned her that the ART will upset her stomach. She is counseled that she must not miss ART and that she needs to use condoms. She is unlikely to get to undetectable by the time she delievers, she is already scheduled for c-section. She will need IV AZT during delivery.  she will need need to continue on ART after she delivers. She has been counseled against breast feeding as well as condom use. She gets PNVX and Flu today. She will rtc in 1 week.

## 2010-11-04 NOTE — Progress Notes (Signed)
Addended by: Wendall Mola A on: 11/04/2010 11:50 AM   Modules accepted: Orders

## 2010-11-05 LAB — GC/CHLAMYDIA PROBE AMP, URINE: Chlamydia, Swab/Urine, PCR: NEGATIVE

## 2010-11-08 LAB — HIV-1 RNA ULTRAQUANT REFLEX TO GENTYP+
HIV 1 RNA Quant: 62400 copies/mL — ABNORMAL HIGH (ref <20)
HIV-1 RNA Quant, Log: 4.8 {Log} — ABNORMAL HIGH (ref <1.30)

## 2010-11-09 ENCOUNTER — Ambulatory Visit (INDEPENDENT_AMBULATORY_CARE_PROVIDER_SITE_OTHER): Payer: Medicaid Other | Admitting: Infectious Diseases

## 2010-11-09 ENCOUNTER — Encounter: Payer: Self-pay | Admitting: Infectious Diseases

## 2010-11-09 VITALS — BP 93/68 | HR 121 | Temp 98.1°F | Ht 62.5 in | Wt 155.0 lb

## 2010-11-09 DIAGNOSIS — B20 Human immunodeficiency virus [HIV] disease: Secondary | ICD-10-CM

## 2010-11-09 NOTE — Assessment & Plan Note (Signed)
She appears to be doing well. Is scheduled to have c-section. Will need IV azt at that time. It is unlikely that she will get to undetectable by the time of her c-section. She has been counseled about breast feeding. Offered condoms. Will see her back next week.

## 2010-11-09 NOTE — Progress Notes (Signed)
  Subjective:    Patient ID: Jodi Morrow, female    DOB: 06-Sep-1985, 25 y.o.   MRN: [id number]  HPI 25 yo G3P2 who is at 33 and 3/[redacted] weeks pregnant. She was found to have HIV+ at her last OB visit. Feels fine, has just been tired. Had previous HIV- on September 05, 2010. CD4 510 and HIV RNA 62,400 at visit 1 week ago. Genotype pending. Chol 251 and Trig 221.   Was started on CBV/KLT at previous visit.  Scheduled for C-section at 38 weeks. Feels like the ART is making her sleepy and worsening her back pain. No probs with nausea or diarrhea. Baby moving well, had a few contractions. No LE edema.   Review of Systems     Objective:   Physical Exam  Constitutional: She appears well-developed and well-nourished.  Eyes: EOM are normal. Pupils are equal, round, and reactive to light.  Neck: Neck supple.  Cardiovascular: Normal rate, regular rhythm and normal heart sounds.   Pulmonary/Chest: Effort normal and breath sounds normal. No respiratory distress.  Abdominal: Soft. Bowel sounds are normal. She exhibits no distension. There is no tenderness.       Appropriately gravid.   Musculoskeletal: She exhibits no edema.  Lymphadenopathy:    She has no cervical adenopathy.          Assessment & Plan:

## 2010-11-10 ENCOUNTER — Other Ambulatory Visit: Payer: Self-pay | Admitting: Obstetrics & Gynecology

## 2010-11-11 LAB — HIV-1 GENOTYPR PLUS

## 2010-11-12 ENCOUNTER — Inpatient Hospital Stay (HOSPITAL_COMMUNITY)
Admission: AD | Admit: 2010-11-12 | Discharge: 2010-11-12 | Disposition: A | Discharge status: Home / Self Care | Payer: Medicaid Other | Source: Ambulatory Visit | Attending: Obstetrics & Gynecology | Admitting: Obstetrics & Gynecology

## 2010-11-12 ENCOUNTER — Encounter (HOSPITAL_COMMUNITY): Payer: Self-pay

## 2010-11-12 DIAGNOSIS — O479 False labor, unspecified: Secondary | ICD-10-CM | POA: Insufficient documentation

## 2010-11-12 HISTORY — DX: Human immunodeficiency virus (HIV) disease: B20

## 2010-11-12 LAB — URINALYSIS, ROUTINE W REFLEX MICROSCOPIC
Ketones, ur: NEGATIVE mg/dL
Nitrite: NEGATIVE
Protein, ur: NEGATIVE mg/dL
Urobilinogen, UA: 0.2 mg/dL (ref 0.0–1.0)

## 2010-11-12 NOTE — Progress Notes (Signed)
Patient states that she is with sudden onset of contractions and constant pelvic pain which started about an hour ago. She denies any vaginal bleeding or lof. She reports good fetal movement

## 2010-11-12 NOTE — Progress Notes (Signed)
About ago started hurting in back, abd, and vagina. A lot of pelvic pressure. Contractions. Dx with HIV 1 month ago

## 2010-11-12 NOTE — Progress Notes (Signed)
Dr Gaynell Face notified of patient history (c-section scheduled for oct 8th due to HIV). Tracing, ctx pattern and sve result. Discharge patient.

## 2010-11-15 ENCOUNTER — Other Ambulatory Visit: Payer: Self-pay | Admitting: Obstetrics & Gynecology

## 2010-11-15 ENCOUNTER — Encounter (HOSPITAL_COMMUNITY)
Admission: RE | Admit: 2010-11-15 | Discharge: 2010-11-15 | Disposition: A | Discharge status: Home / Self Care | Payer: Medicaid Other | Source: Ambulatory Visit | Attending: Obstetrics & Gynecology | Admitting: Obstetrics & Gynecology

## 2010-11-15 ENCOUNTER — Encounter (HOSPITAL_COMMUNITY): Payer: Self-pay

## 2010-11-15 LAB — CBC
MCHC: 32.6 g/dL (ref 30.0–36.0)
Platelets: 204 10*3/uL (ref 150–400)
RDW: 14.5 % (ref 11.5–15.5)
WBC: 8.3 10*3/uL (ref 4.0–10.5)

## 2010-11-15 LAB — SURGICAL PCR SCREEN
MRSA, PCR: NEGATIVE
Staphylococcus aureus: NEGATIVE

## 2010-11-15 MED ORDER — DEXTROSE 5 % IV SOLN: 2.0000 g | Freq: Once | INTRAVENOUS | Status: DC

## 2010-11-15 NOTE — Patient Instructions (Signed)
Jodi Morrow  11/15/2010   Your procedure is scheduled on:  11/21/10  Report to [facility] at 1115 AM.  Call this number if you have problems the morning of surgery: [phone]  On the day of surgery, once you arrive to [facility] call ext. [phone] to inform us of your arrival.    Remember:   Do not eat food:After Midnight.  Do not drink clear liquids: after 8:30 am Mon  Take these medicines the morning of surgery with A SIP OF WATER: none   Do not wear jewelry, make-up or nail polish.  Do not bring valuables to the hospital.  Contacts, dentures or bridgework may not be worn into surgery.  Leave suitcase in the car. After surgery it may be brought to your room.  For patients admitted to the hospital, checkout time is 11:00 AM the day of discharge.   Patients discharged the day of surgery will not be allowed to drive home.  Name and phone number of your driver: [name]- [phone]  Special Instructions:   Please read over the following fact sheets that you were given: none

## 2010-11-18 ENCOUNTER — Ambulatory Visit (INDEPENDENT_AMBULATORY_CARE_PROVIDER_SITE_OTHER): Payer: Medicaid Other | Admitting: Infectious Diseases

## 2010-11-18 ENCOUNTER — Other Ambulatory Visit: Payer: Self-pay | Admitting: Infectious Diseases

## 2010-11-18 ENCOUNTER — Encounter: Payer: Self-pay | Admitting: Infectious Diseases

## 2010-11-18 VITALS — BP 112/78 | HR 89 | Temp 98.1°F | Ht 62.0 in | Wt 158.0 lb

## 2010-11-18 DIAGNOSIS — B20 Human immunodeficiency virus [HIV] disease: Secondary | ICD-10-CM

## 2010-11-18 NOTE — Progress Notes (Signed)
  Subjective:    Patient ID: Jodi Morrow, female    DOB: Jun 06, 1985, 25 y.o.   MRN: [id number]  HPI 25 yo G3P2 who is at 71 and 5/[redacted] weeks pregnant. She was found to have HIV+ at her last OB visit. Feels fine, has just been tired. Had previous HIV- on September 05, 2010. CD4 510 and HIV RNA 62,400 at visit 1 week ago. Genotype naive. Chol 251 and Trig 221.  Was started on CBV/KLT at previous visit.  Scheduled for C-section at 38 weeks, 11-23-10. Having contractions, baby moving a lot. Has bene gaining wt. Eating well, has had some R ankle swelling. No diarrhea.  Taking ART well, wants to get IUD after delivery.     Review of Systems     Objective:   Physical Exam  Constitutional: She appears well-developed and well-nourished.  Eyes: EOM are normal. Pupils are equal, round, and reactive to light.  Cardiovascular: Normal rate, regular rhythm and normal heart sounds.   Pulmonary/Chest: Effort normal and breath sounds normal.  Abdominal: Soft. Bowel sounds are normal. There is no tenderness.            Assessment & Plan:

## 2010-11-18 NOTE — Assessment & Plan Note (Addendum)
She is doing well with her ART. Will recheck her CD4 and ask for "stat" genotype. She is scheduled for c-section. She needs IV AZT during this procedure. She is cautioned not to breast feed. Spoke to her about testing, therapy for her child after delivery. Will see her back in 3-4 weeks.

## 2010-11-21 ENCOUNTER — Other Ambulatory Visit: Payer: Self-pay | Admitting: Obstetrics & Gynecology

## 2010-11-21 MED ORDER — ZIDOVUDINE 10 MG/ML IV SOLN: 1.0000 mg/kg/h | INTRAVENOUS | Status: DC

## 2010-11-21 MED ORDER — ZIDOVUDINE 10 MG/ML IV SOLN: 2.0000 mg/kg | Freq: Once | INTRAVENOUS | Status: DC

## 2010-11-21 NOTE — Progress Notes (Signed)
Addended byTamela Oddi MD, Artice Holohan on: 11/21/2010 04:49 PM   Modules accepted: Orders

## 2010-11-22 ENCOUNTER — Encounter (HOSPITAL_COMMUNITY): Payer: Self-pay | Admitting: Obstetrics & Gynecology

## 2010-11-22 MED ORDER — ZIDOVUDINE 10 MG/ML IV SOLN
2.0000 mg/kg | Freq: Once | INTRAVENOUS | Status: AC
  Administered 2010-11-23: 141 mg via INTRAVENOUS
  Filled 2010-11-22: qty 14.1

## 2010-11-22 MED ORDER — ZIDOVUDINE 10 MG/ML IV SOLN
1.0000 mg/kg/h | INTRAVENOUS | Status: DC
  Administered 2010-11-23: 1 mg/kg/h via INTRAVENOUS
  Filled 2010-11-22: qty 40

## 2010-11-22 MED ORDER — CEFAZOLIN SODIUM-DEXTROSE 2-3 GM-% IV SOLR
2.0000 g | INTRAVENOUS | Status: DC
  Filled 2010-11-22: qty 50

## 2010-11-22 NOTE — H&P (Signed)
Jodi Morrow is a 25 y.o. female presenting for a scheduled C/D. Maternal Medical History:  Reason for admission: Reason for Admission:   nauseaFor a scheduled C/D.  Recently diagnosed w/HIV.  Viral load >1000 several days ago.  Fetal activity: Perceived fetal activity is normal.    Prenatal complications: HIV.     OB History    Grav Para Term Preterm Abortions TAB SAB Ect Mult Living   3 2 1 1      2      Past Medical History  Diagnosis Date  . UTI (urinary tract infection)     recent UTI- resolved  . Preterm delivery   . HIV (human immunodeficiency virus infection)     recently diagnosed  . Pyelonephritis     1st pregnancy   Past Surgical History  Procedure Date  . Tonsillectomy    Family History: family history includes Cancer in her father and mother; Diabetes in her mother; Hypertension in her father; Stroke in her father; and Thyroid disease in her father and mother. Social History:  reports that she has never smoked. She has never used smokeless tobacco. She reports that she does not drink alcohol or use illicit drugs.  Review of Systems  Constitutional: Negative for fever.  Eyes: Negative for blurred vision.  Respiratory: Negative for shortness of breath.   Gastrointestinal: Negative for nausea and vomiting.  Skin: Negative for rash.  Neurological: Negative for headaches.      Last menstrual period 03/04/2010. Maternal Exam:  Abdomen: Patient reports no abdominal tenderness. Introitus: not evaluated.     Fetal Exam Fetal Monitor Review: Mode: hand-held doppler probe.       Physical Exam  Constitutional: She appears well-developed.  HENT:  Head: Normocephalic.  Neck: Neck supple. No thyromegaly present.  Cardiovascular: Normal rate and regular rhythm.   Respiratory: Breath sounds normal.  GI: Soft. Bowel sounds are normal.  Skin: No rash noted.    Prenatal labs: ABO, Rh: O/--/-- (09/19 0000) Antibody: Negative (09/19 0000) Rubella: Immune (07/17  0000) RPR: NON REAC (09/21 1139)  HBsAg: NEGATIVE (09/21 1139)  HIV: Non-reactive (07/17 0000)  GBS:     Assessment/Plan: 25 y.o. w/an IUP @ [redacted]w[redacted]d for a scheduled C/D.  HIV positive w/viral load >1000.  Admission C/D   JACKSON-MOORE,Stephaun Million A 11/22/2010, 6:14 PM

## 2010-11-23 ENCOUNTER — Encounter (HOSPITAL_COMMUNITY): Payer: Self-pay | Admitting: Neonatology

## 2010-11-23 ENCOUNTER — Encounter (HOSPITAL_COMMUNITY): Payer: Self-pay | Admitting: Anesthesiology

## 2010-11-23 ENCOUNTER — Other Ambulatory Visit: Payer: Self-pay | Admitting: Obstetrics & Gynecology

## 2010-11-23 ENCOUNTER — Encounter (HOSPITAL_COMMUNITY)
Admission: RE | Disposition: A | Discharge status: Home / Self Care | Payer: Self-pay | Source: Ambulatory Visit | Attending: Obstetrics & Gynecology

## 2010-11-23 ENCOUNTER — Inpatient Hospital Stay (HOSPITAL_COMMUNITY): Payer: Medicaid Other | Admitting: Anesthesiology

## 2010-11-23 ENCOUNTER — Inpatient Hospital Stay (HOSPITAL_COMMUNITY)
Admission: RE | Admit: 2010-11-23 | Discharge: 2010-11-25 | DRG: 766 | Disposition: A | Discharge status: Home / Self Care | Payer: Medicaid Other | Source: Ambulatory Visit | Attending: Obstetrics & Gynecology | Admitting: Obstetrics & Gynecology

## 2010-11-23 DIAGNOSIS — O9903 Anemia complicating the puerperium: Secondary | ICD-10-CM | POA: Diagnosis not present

## 2010-11-23 DIAGNOSIS — O98519 Other viral diseases complicating pregnancy, unspecified trimester: Principal | ICD-10-CM | POA: Diagnosis present

## 2010-11-23 DIAGNOSIS — Z21 Asymptomatic human immunodeficiency virus [HIV] infection status: Secondary | ICD-10-CM | POA: Diagnosis present

## 2010-11-23 DIAGNOSIS — D649 Anemia, unspecified: Secondary | ICD-10-CM | POA: Diagnosis not present

## 2010-11-23 DIAGNOSIS — O99019 Anemia complicating pregnancy, unspecified trimester: Secondary | ICD-10-CM | POA: Diagnosis present

## 2010-11-23 DIAGNOSIS — B20 Human immunodeficiency virus [HIV] disease: Secondary | ICD-10-CM

## 2010-11-23 LAB — HIV-1 GENOTYPR PLUS

## 2010-11-23 SURGERY — Surgical Case
Anesthesia: Spinal | Site: Abdomen | Wound class: Clean Contaminated

## 2010-11-23 MED ORDER — DIBUCAINE 1 % RE OINT: 1.0000 "application " | TOPICAL_OINTMENT | RECTAL | Status: DC | PRN

## 2010-11-23 MED ORDER — KETOROLAC TROMETHAMINE 60 MG/2ML IM SOLN
60.0000 mg | Freq: Once | INTRAMUSCULAR | Status: AC | PRN
  Filled 2010-11-23: qty 2

## 2010-11-23 MED ORDER — SODIUM CHLORIDE 0.9 % IJ SOLN: 3.0000 mL | INTRAMUSCULAR | Status: DC | PRN

## 2010-11-23 MED ORDER — PHENYLEPHRINE 40 MCG/ML (10ML) SYRINGE FOR IV PUSH (FOR BLOOD PRESSURE SUPPORT)
PREFILLED_SYRINGE | INTRAVENOUS | Status: AC
  Filled 2010-11-23: qty 15

## 2010-11-23 MED ORDER — DIPHENHYDRAMINE HCL 25 MG PO CAPS: 25.0000 mg | ORAL_CAPSULE | Freq: Four times a day (QID) | ORAL | Status: DC | PRN

## 2010-11-23 MED ORDER — OXYTOCIN 20 UNITS IN LACTATED RINGERS INFUSION - SIMPLE
INTRAVENOUS | Status: DC | PRN
  Administered 2010-11-23: 20 [IU] via INTRAVENOUS

## 2010-11-23 MED ORDER — ONDANSETRON HCL 4 MG/2ML IJ SOLN
INTRAMUSCULAR | Status: DC | PRN
  Administered 2010-11-23: 4 mg via INTRAVENOUS

## 2010-11-23 MED ORDER — SCOPOLAMINE 1 MG/3DAYS TD PT72
MEDICATED_PATCH | TRANSDERMAL | Status: AC
  Administered 2010-11-23: 1.5 mg
  Filled 2010-11-23: qty 1

## 2010-11-23 MED ORDER — LACTATED RINGERS IV SOLN: INTRAVENOUS | Status: DC

## 2010-11-23 MED ORDER — LACTATED RINGERS IV SOLN
INTRAVENOUS | Status: DC
  Administered 2010-11-23: 08:00:00 via INTRAVENOUS
  Administered 2010-11-23: 1000 mL via INTRAVENOUS
  Administered 2010-11-23: 11:00:00 via INTRAVENOUS

## 2010-11-23 MED ORDER — WITCH HAZEL-GLYCERIN EX PADS: 1.0000 "application " | MEDICATED_PAD | CUTANEOUS | Status: DC | PRN

## 2010-11-23 MED ORDER — ONDANSETRON HCL 4 MG/2ML IJ SOLN
INTRAMUSCULAR | Status: AC
  Filled 2010-11-23: qty 2

## 2010-11-23 MED ORDER — MEDROXYPROGESTERONE ACETATE 150 MG/ML IM SUSP: 150.0000 mg | INTRAMUSCULAR | Status: DC | PRN

## 2010-11-23 MED ORDER — KETOROLAC TROMETHAMINE 30 MG/ML IJ SOLN: 30.0000 mg | Freq: Four times a day (QID) | INTRAMUSCULAR | Status: AC | PRN

## 2010-11-23 MED ORDER — ZOLPIDEM TARTRATE 5 MG PO TABS: 5.0000 mg | ORAL_TABLET | Freq: Every evening | ORAL | Status: DC | PRN

## 2010-11-23 MED ORDER — ONDANSETRON HCL 4 MG/2ML IJ SOLN: 4.0000 mg | INTRAMUSCULAR | Status: DC | PRN

## 2010-11-23 MED ORDER — EPHEDRINE SULFATE 50 MG/ML IJ SOLN
INTRAMUSCULAR | Status: DC | PRN
  Administered 2010-11-23: 20 mg via INTRAVENOUS
  Administered 2010-11-23 (×2): 10 mg via INTRAVENOUS

## 2010-11-23 MED ORDER — EPHEDRINE 5 MG/ML INJ
INTRAVENOUS | Status: AC
  Filled 2010-11-23: qty 10

## 2010-11-23 MED ORDER — SODIUM CHLORIDE 0.9 % IV SOLN
1.0000 ug/kg/h | INTRAVENOUS | Status: DC | PRN
  Filled 2010-11-23: qty 2.5

## 2010-11-23 MED ORDER — SENNOSIDES-DOCUSATE SODIUM 8.6-50 MG PO TABS
2.0000 | ORAL_TABLET | Freq: Every day | ORAL | Status: DC
  Administered 2010-11-23 – 2010-11-24 (×2): 2 via ORAL

## 2010-11-23 MED ORDER — IBUPROFEN 600 MG PO TABS
600.0000 mg | ORAL_TABLET | Freq: Four times a day (QID) | ORAL | Status: DC
  Administered 2010-11-23 – 2010-11-25 (×8): 600 mg via ORAL
  Filled 2010-11-23 (×4): qty 1

## 2010-11-23 MED ORDER — ONDANSETRON HCL 4 MG/2ML IJ SOLN: 4.0000 mg | Freq: Three times a day (TID) | INTRAMUSCULAR | Status: DC | PRN

## 2010-11-23 MED ORDER — KETOROLAC TROMETHAMINE 30 MG/ML IJ SOLN
30.0000 mg | Freq: Four times a day (QID) | INTRAMUSCULAR | Status: AC | PRN
  Administered 2010-11-23: 30 mg via INTRAVENOUS
  Filled 2010-11-23: qty 1

## 2010-11-23 MED ORDER — OXYTOCIN 10 UNIT/ML IJ SOLN
INTRAMUSCULAR | Status: AC
  Filled 2010-11-23: qty 1

## 2010-11-23 MED ORDER — MORPHINE SULFATE 0.5 MG/ML IJ SOLN
INTRAMUSCULAR | Status: AC
  Filled 2010-11-23: qty 10

## 2010-11-23 MED ORDER — DIPHENHYDRAMINE HCL 50 MG/ML IJ SOLN: 25.0000 mg | INTRAMUSCULAR | Status: DC | PRN

## 2010-11-23 MED ORDER — METOCLOPRAMIDE HCL 5 MG/ML IJ SOLN: 10.0000 mg | Freq: Three times a day (TID) | INTRAMUSCULAR | Status: DC | PRN

## 2010-11-23 MED ORDER — NALBUPHINE HCL 10 MG/ML IJ SOLN
5.0000 mg | INTRAMUSCULAR | Status: DC | PRN
  Administered 2010-11-23: 10 mg via INTRAVENOUS
  Filled 2010-11-23: qty 1

## 2010-11-23 MED ORDER — LANOLIN HYDROUS EX OINT: 1.0000 "application " | TOPICAL_OINTMENT | CUTANEOUS | Status: DC | PRN

## 2010-11-23 MED ORDER — TETANUS-DIPHTH-ACELL PERTUSSIS 5-2.5-18.5 LF-MCG/0.5 IM SUSP
0.5000 mL | Freq: Once | INTRAMUSCULAR | Status: AC
  Administered 2010-11-24: 0.5 mL via INTRAMUSCULAR
  Filled 2010-11-23: qty 0.5

## 2010-11-23 MED ORDER — OXYCODONE-ACETAMINOPHEN 5-325 MG PO TABS
1.0000 | ORAL_TABLET | ORAL | Status: DC | PRN
  Administered 2010-11-24: 1 via ORAL
  Administered 2010-11-25: 2 via ORAL
  Filled 2010-11-23 (×3): qty 1

## 2010-11-23 MED ORDER — OXYTOCIN 20 UNITS IN LACTATED RINGERS INFUSION - SIMPLE
INTRAVENOUS | Status: AC
  Administered 2010-11-23: 125 mL/h via INTRAVENOUS
  Filled 2010-11-23: qty 1000

## 2010-11-23 MED ORDER — MEASLES, MUMPS & RUBELLA VAC ~~LOC~~ INJ: 0.5000 mL | INJECTION | Freq: Once | SUBCUTANEOUS | Status: DC

## 2010-11-23 MED ORDER — DIPHENHYDRAMINE HCL 25 MG PO CAPS
25.0000 mg | ORAL_CAPSULE | ORAL | Status: DC | PRN
Start: 2010-11-23 — End: 2010-11-25

## 2010-11-23 MED ORDER — FERROUS SULFATE 325 (65 FE) MG PO TABS
325.0000 mg | ORAL_TABLET | Freq: Two times a day (BID) | ORAL | Status: DC
  Administered 2010-11-24 – 2010-11-25 (×4): 325 mg via ORAL
  Filled 2010-11-23 (×4): qty 1

## 2010-11-23 MED ORDER — LOPINAVIR-RITONAVIR 200-50 MG PO TABS
2.0000 | ORAL_TABLET | Freq: Two times a day (BID) | ORAL | Status: DC
  Administered 2010-11-23 – 2010-11-25 (×4): 2 via ORAL
  Filled 2010-11-23 (×7): qty 2

## 2010-11-23 MED ORDER — OXYTOCIN 20 UNITS IN LACTATED RINGERS INFUSION - SIMPLE
125.0000 mL/h | INTRAVENOUS | Status: AC
  Administered 2010-11-23: 125 mL/h via INTRAVENOUS

## 2010-11-23 MED ORDER — FENTANYL CITRATE 0.05 MG/ML IJ SOLN
INTRAMUSCULAR | Status: DC | PRN
  Administered 2010-11-23: 15 ug via INTRATHECAL

## 2010-11-23 MED ORDER — HYDROMORPHONE HCL 1 MG/ML IJ SOLN: 0.2500 mg | INTRAMUSCULAR | Status: DC | PRN

## 2010-11-23 MED ORDER — MEPERIDINE HCL 25 MG/ML IJ SOLN: 6.2500 mg | INTRAMUSCULAR | Status: DC | PRN

## 2010-11-23 MED ORDER — MAGNESIUM HYDROXIDE 400 MG/5ML PO SUSP: 30.0000 mL | ORAL | Status: DC | PRN

## 2010-11-23 MED ORDER — MORPHINE SULFATE (PF) 0.5 MG/ML IJ SOLN
INTRAMUSCULAR | Status: DC | PRN
  Administered 2010-11-23: .1 mg via INTRATHECAL

## 2010-11-23 MED ORDER — PRENATAL PLUS 27-1 MG PO TABS
1.0000 | ORAL_TABLET | Freq: Every day | ORAL | Status: DC
  Administered 2010-11-24 – 2010-11-25 (×2): 1 via ORAL
  Filled 2010-11-23 (×2): qty 1

## 2010-11-23 MED ORDER — ONDANSETRON HCL 4 MG PO TABS: 4.0000 mg | ORAL_TABLET | ORAL | Status: DC | PRN

## 2010-11-23 MED ORDER — PHENYLEPHRINE HCL 10 MG/ML IJ SOLN
INTRAMUSCULAR | Status: DC | PRN
  Administered 2010-11-23: 80 ug via INTRAVENOUS
  Administered 2010-11-23 (×2): 200 ug via INTRAVENOUS

## 2010-11-23 MED ORDER — BUPIVACAINE IN DEXTROSE 0.75-8.25 % IT SOLN
INTRATHECAL | Status: DC | PRN
  Administered 2010-11-23: 1.5 mL via INTRATHECAL

## 2010-11-23 MED ORDER — SIMETHICONE 80 MG PO CHEW
80.0000 mg | CHEWABLE_TABLET | ORAL | Status: DC | PRN
  Administered 2010-11-23 – 2010-11-24 (×2): 80 mg via ORAL

## 2010-11-23 MED ORDER — NALBUPHINE HCL 10 MG/ML IJ SOLN
5.0000 mg | INTRAMUSCULAR | Status: DC | PRN
  Filled 2010-11-23: qty 1

## 2010-11-23 MED ORDER — SCOPOLAMINE 1 MG/3DAYS TD PT72: 1.0000 | MEDICATED_PATCH | Freq: Once | TRANSDERMAL | Status: DC

## 2010-11-23 MED ORDER — LAMIVUDINE-ZIDOVUDINE 150-300 MG PO TABS
1.0000 | ORAL_TABLET | Freq: Two times a day (BID) | ORAL | Status: DC
  Administered 2010-11-23 – 2010-11-25 (×4): 1 via ORAL
  Filled 2010-11-23 (×7): qty 1

## 2010-11-23 MED ORDER — CEFAZOLIN SODIUM 1-5 GM-% IV SOLN
INTRAVENOUS | Status: DC | PRN
  Administered 2010-11-23: 2 g via INTRAVENOUS

## 2010-11-23 MED ORDER — IBUPROFEN 600 MG PO TABS
600.0000 mg | ORAL_TABLET | Freq: Four times a day (QID) | ORAL | Status: DC | PRN
  Filled 2010-11-23 (×4): qty 1

## 2010-11-23 MED ORDER — NALOXONE HCL 0.4 MG/ML IJ SOLN: 0.4000 mg | INTRAMUSCULAR | Status: DC | PRN

## 2010-11-23 MED ORDER — DIPHENHYDRAMINE HCL 50 MG/ML IJ SOLN: 12.5000 mg | INTRAMUSCULAR | Status: DC | PRN

## 2010-11-23 MED ORDER — FENTANYL CITRATE 0.05 MG/ML IJ SOLN
INTRAMUSCULAR | Status: AC
  Filled 2010-11-23: qty 2

## 2010-11-23 SURGICAL SUPPLY — 45 items
BENZOIN TINCTURE PRP APPL 2/3 (GAUZE/BANDAGES/DRESSINGS) ×2 IMPLANT
CANISTER WOUND CARE 500ML ATS (WOUND CARE) IMPLANT
CHLORAPREP W/TINT 26ML (MISCELLANEOUS) ×2 IMPLANT
CLOSURE STERI STRIP 1/2 X4 (GAUZE/BANDAGES/DRESSINGS) ×2 IMPLANT
CLOTH BEACON ORANGE TIMEOUT ST (SAFETY) ×2 IMPLANT
CONTAINER PREFILL 10% NBF 15ML (MISCELLANEOUS) IMPLANT
DERMABOND ADVANCED (GAUZE/BANDAGES/DRESSINGS) ×1
DERMABOND ADVANCED .7 DNX12 (GAUZE/BANDAGES/DRESSINGS) ×1 IMPLANT
DRESSING TELFA 8X3 (GAUZE/BANDAGES/DRESSINGS) ×2 IMPLANT
DRSG PAD ABDOMINAL 8X10 ST (GAUZE/BANDAGES/DRESSINGS) ×2 IMPLANT
DRSG VAC ATS LRG SENSATRAC (GAUZE/BANDAGES/DRESSINGS) IMPLANT
DRSG VAC ATS MED SENSATRAC (GAUZE/BANDAGES/DRESSINGS) IMPLANT
DRSG VAC ATS SM SENSATRAC (GAUZE/BANDAGES/DRESSINGS) IMPLANT
ELECT REM PT RETURN 9FT ADLT (ELECTROSURGICAL) ×2
ELECTRODE REM PT RTRN 9FT ADLT (ELECTROSURGICAL) ×1 IMPLANT
EXTRACTOR VACUUM M CUP 4 TUBE (SUCTIONS) IMPLANT
GAUZE SPONGE 4X4 12PLY STRL LF (GAUZE/BANDAGES/DRESSINGS) ×4 IMPLANT
GLOVE BIO SURGEON STRL SZ 6.5 (GLOVE) ×4 IMPLANT
GOWN PREVENTION PLUS LG XLONG (DISPOSABLE) ×6 IMPLANT
KIT ABG SYR 3ML LUER SLIP (SYRINGE) IMPLANT
NEEDLE HYPO 25X5/8 SAFETYGLIDE (NEEDLE) ×2 IMPLANT
NS IRRIG 1000ML POUR BTL (IV SOLUTION) ×4 IMPLANT
PACK C SECTION WH (CUSTOM PROCEDURE TRAY) ×2 IMPLANT
PAD ABD 7.5X8 STRL (GAUZE/BANDAGES/DRESSINGS) ×2 IMPLANT
RTRCTR C-SECT PINK 25CM LRG (MISCELLANEOUS) IMPLANT
RTRCTR C-SECT PINK 34CM XLRG (MISCELLANEOUS) IMPLANT
SLEEVE SCD COMPRESS KNEE MED (MISCELLANEOUS) IMPLANT
SPONGE GAUZE 4X4 12PLY (GAUZE/BANDAGES/DRESSINGS) ×2 IMPLANT
STAPLER VISISTAT 35W (STAPLE) IMPLANT
SUT MNCRL 0 VIOLET CTX 36 (SUTURE) ×2 IMPLANT
SUT MNCRL AB 3-0 PS2 27 (SUTURE) IMPLANT
SUT MONOCRYL 0 CTX 36 (SUTURE) ×2
SUT PDS AB 0 CTX 36 PDP370T (SUTURE) ×2 IMPLANT
SUT PLAIN 0 NONE (SUTURE) IMPLANT
SUT VIC AB 0 CT1 27 (SUTURE) ×3
SUT VIC AB 0 CT1 27XBRD ANBCTR (SUTURE) ×3 IMPLANT
SUT VIC AB 2-0 CT1 (SUTURE) IMPLANT
SUT VIC AB 2-0 CT1 27 (SUTURE) ×1
SUT VIC AB 2-0 CT1 TAPERPNT 27 (SUTURE) ×1 IMPLANT
SUT VIC AB 2-0 SH 27 (SUTURE)
SUT VIC AB 2-0 SH 27XBRD (SUTURE) IMPLANT
TAPE CLOTH SURG 4X10 WHT LF (GAUZE/BANDAGES/DRESSINGS) ×2 IMPLANT
TOWEL OR 17X24 6PK STRL BLUE (TOWEL DISPOSABLE) ×4 IMPLANT
TRAY FOLEY CATH 14FR (SET/KITS/TRAYS/PACK) ×2 IMPLANT
WATER STERILE IRR 1000ML POUR (IV SOLUTION) IMPLANT

## 2010-11-23 NOTE — Op Note (Signed)
Cesarean Section Procedure Note   Jodi Morrow   11/23/2010  Indications: HIV positive, viral load >1000, IUP @ 38 wks   Pre-operative Diagnosis: 042 POS/ HIGH VIRAL LOAD/ PREVENTIVE VIRTUAL TRANSMISSION.   Post-operative Diagnosis: Same   Surgeon: Roseanna Rainbow  Assistants: Francoise Ceo  Anesthesia: spinal  Procedure Details:  The patient was seen in the Holding Room. The risks, benefits, complications, treatment options, and expected outcomes were discussed with the patient. The patient concurred with the proposed plan, giving informed consent. The patient was identified as Jodi Morrow and the procedure verified as C-Section Delivery. A Time Out was held and the above information confirmed.  After induction of anesthesia, the patient was draped and prepped in the usual sterile manner. A transverse incision was made and carried down through the subcutaneous tissue to the fascia. The fascial incision was made and extended transversely. The fascia was separated from the underlying rectus tissue superiorly and inferiorly. The peritoneum was identified and entered. The peritoneal incision was extended longitudinally. The utero-vesical peritoneal reflection was incised transversely and the bladder flap was bluntly freed from the lower uterine segment. A low transverse uterine incision was made. Delivered from cephalic presentation was living newborn infant. A cord ph was not sent. The umbilical cord was clamped and cut cord. A sample was obtained for evaluation. The placenta was removed Intact and appeared normal.  The uterine incision was closed with running locked sutures of 1-0 Monocryl. A second imbricating layer of the same suture was placed.  Hemostasis was observed. The paracolic gutters were irrigated. The fascia was then reapproximated with running sutures of 1-0Vicryl. The subcuticular closure was performed using 3-0 Monocryl.  Instrument, sponge, and needle counts were  correct prior the abdominal closure and were correct at the conclusion of the case.    Findings:  See above   Estimated Blood Loss: 600 ml   Total IV Fluids:  per Anesthesiology  Urine Output:  per Anesthesiology  Specimens:  Specimens    None       Complications: no complications  Disposition: PACU - hemodynamically stable.  Maternal Condition: stable   Baby condition / location:  nursery-stable    Signed: Surgeon(s): Roseanna Rainbow, MD Kathreen Cosier, MD

## 2010-11-23 NOTE — Consult Note (Signed)
Called to attend term gestation C/S secondary to maternal HIV c viral load > 1000. AROM at delivery and infant delivered from vertex with no cord issues. Given tactile stimulation and bulb suction. No dysmorphic features.   Shown to parents then carried by father to Transitional Nursery in warm blanket.  Care to assigned pediatrician.  Dagoberto Ligas MD [facility]

## 2010-11-23 NOTE — Anesthesia Postprocedure Evaluation (Signed)
Anesthesia Post Note  Patient: Jodi Morrow  Procedure(s) Performed:  CESAREAN SECTION  Anesthesia type: Spinal  Patient location: PACU  Post pain: Pain level controlled  Post assessment: Post-op Vital signs reviewed  Last Vitals:  Filed Vitals:   11/23/10 1300  BP: 106/68  Pulse: 67  Temp:   Resp: 16    Post vital signs: Reviewed  Level of consciousness: awake  Complications: No apparent anesthesia complications

## 2010-11-23 NOTE — Anesthesia Postprocedure Evaluation (Signed)
  Anesthesia Post-op Note  Patient: Jodi Morrow  Procedure(s) Performed:  CESAREAN SECTION  Patient Location: Mother/Baby  Anesthesia Type: Spinal  Level of Consciousness: alert  and oriented  Airway and Oxygen Therapy: Patient Spontanous Breathing  Post-op Pain: mild  Post-op Assessment: Patient's Cardiovascular Status Stable and Respiratory Function Stable  Post-op Vital Signs: stable  Complications: No apparent anesthesia complications

## 2010-11-23 NOTE — Anesthesia Procedure Notes (Signed)
Spinal Block  Patient location during procedure: OR Start time: 11/23/2010 11:07 AM Staffing Performed by: anesthesiologist  Preanesthetic Checklist Completed: patient identified, site marked, surgical consent, pre-op evaluation, timeout performed, IV checked, risks and benefits discussed and monitors and equipment checked Spinal Block Patient position: sitting Prep: site prepped and draped and DuraPrep Patient monitoring: heart rate, cardiac monitor, continuous pulse ox and blood pressure Approach: midline Location: L3-4 Injection technique: single-shot Needle Needle type: Sprotte  Needle gauge: 24 G Needle length: 9 cm Assessment Sensory level: T4 Additional Notes Clear free flow csf on first attempt

## 2010-11-23 NOTE — Anesthesia Preprocedure Evaluation (Addendum)
Anesthesia Evaluation  Name, MR# and DOB Patient awake  General Assessment Comment  Reviewed: Allergy & Precautions, H&P , NPO status , Patient's Chart, lab work & pertinent test results, reviewed documented beta blocker date and time   History of Anesthesia Complications Negative for: history of anesthetic complications  Airway Mallampati: III TM Distance: >3 FB Neck ROM: full    Dental  (+) Teeth Intact   Pulmonary  clear to auscultation        Cardiovascular regular Normal    Neuro/Psych Negative Neurological ROS  Negative Psych ROS   GI/Hepatic negative GI ROS Neg liver ROS    Endo/Other  Negative Endocrine ROS  Renal/GU negative Renal ROS  Genitourinary negative   Musculoskeletal   Abdominal   Peds  Hematology  (+) HIV,   Anesthesia Other Findings   Reproductive/Obstetrics (+) Pregnancy                           Anesthesia Physical Anesthesia Plan  ASA: II  Anesthesia Plan: Spinal   Post-op Pain Management:    Induction:   Airway Management Planned:   Additional Equipment:   Intra-op Plan:   Post-operative Plan:   Informed Consent: I have reviewed the patients History and Physical, chart, labs and discussed the procedure including the risks, benefits and alternatives for the proposed anesthesia with the patient or authorized representative who has indicated his/her understanding and acceptance.   Dental Advisory Given  Plan Discussed with: CRNA and Surgeon  Anesthesia Plan Comments:         Anesthesia Quick Evaluation

## 2010-11-23 NOTE — Transfer of Care (Signed)
Immediate Anesthesia Transfer of Care Note  Patient: Jodi Morrow  Procedure(s) Performed:  CESAREAN SECTION  Patient Location: PACU  Anesthesia Type: Spinal  Level of Consciousness: awake, alert  and oriented  Airway & Oxygen Therapy: Patient Spontanous Breathing  Post-op Assessment: Report given to PACU RN and Post -op Vital signs reviewed and stable  Post vital signs: Reviewed and stable  Complications: No apparent anesthesia complications

## 2010-11-24 NOTE — Progress Notes (Signed)
  Subjective: POD# 1 s/p Cesarean Delivery.  Indications: elective  RH status/Rubella reviewed. Feeding: bottle Patient reports tolerating PO.  Denies HA/SOB/C/P/N/V/dizziness.  Reports flatus or BM. Breast symptoms: None.  She reports vaginal bleeding as normal, without clots.  She is ambulating, urinating without difficulty.     Objective: Vital signs in last 24 hours: BP 107/75  Pulse 86  Temp(Src) 97.9 F (36.6 C) (Oral)  Resp 18  Wt 70.308 kg (155 lb)  SpO2 99%  LMP 03/04/2010  Breastfeeding? Unknown   Total I/O In: -  Out: 200 [Urine:200]   Physical Exam:  General: alert CV: Regular rate and rhythm Resp: clear Abdomen: soft, nontender, normal bowel sounds Lochia: minimal Uterine Fundus: firm, below umbilicus, nontender Incision: dressing C/D Ext: extremities normal, atraumatic, no cyanosis or edema    Basename 11/24/10 0540  HGB 9.0*  HCT --      Assessment/Plan: 25 y.o.  status post Cesarean section. POD# 1.   Doing well, stable.  Anemia stable.             Advance diet as tolerated Start po pain meds D/C foley  HLIV  Ambulate IS Routine post-op care  JACKSON-MOORE,Twania Bujak A 11/24/2010, 1:52 PM

## 2010-11-24 NOTE — Progress Notes (Signed)
PSYCHOSOCIAL ASSESSMENT ~ MATERNAL/CHILD Name:  Jodi Morrow                                                                                                         Age: 25   Referral Date:       10 / 11  / 12  Reason/Source: O42 / CN  I. FAMILY/HOME ENVIRONMENT A. Child's Legal Guardian _X__Parent(s) ___Grandparent ___Foster parent ___DSS_________________ Name:  Cameron Liera                                                               DOB: //                     Age: 25  Address: [address] ; [place], [place] [address]  Name:      Wayne Morrow, Jr.                                                          DOB: //                     Age: 25  Address:   B. Other Household Members/Support Persons Name: Mianna Bland                  Relationship: daughter 7yr    DOB ___/___/___                   Name: Khalil Bland-Rodrigue           Relationship: son     5yr       DOB ___/___/___                   Name:                                         Relationship:                        DOB ___/___/___                   Name:                                         Relationship:                        DOB ___/___/___  C. Other Support:   II. PSYCHOSOCIAL DATA 

## 2010-11-25 DIAGNOSIS — O99019 Anemia complicating pregnancy, unspecified trimester: Secondary | ICD-10-CM | POA: Diagnosis present

## 2010-11-25 MED ORDER — FERROUS SULFATE 325 (65 FE) MG PO TABS: 325.0000 mg | ORAL_TABLET | Freq: Two times a day (BID) | ORAL | Status: DC

## 2010-11-25 MED ORDER — IBUPROFEN 600 MG PO TABS: 600.0000 mg | ORAL_TABLET | Freq: Four times a day (QID) | ORAL | Status: AC | PRN

## 2010-11-25 MED ORDER — OXYCODONE-ACETAMINOPHEN 5-325 MG PO TABS: 1.0000 | ORAL_TABLET | ORAL | Status: AC | PRN

## 2010-11-25 NOTE — Progress Notes (Signed)
Sw faxed referral form to [facility] yesterday evening to request an appointment.  Ida Norell, social worker is out of the office.  Dr. Peters will have Ida call the patient on Monday to give follow up appointment time.  

## 2010-11-25 NOTE — Progress Notes (Signed)
Subjective: POD #2 s/p LTC/S  Indication:elective Patient reports tolerating PO.  Denies HA/SOB/C/P/N/V/dizziness.  Reports flatus or BM. Breast symptoms: None.  She reports vaginal bleeding as normal, without clots.  She is ambulating, urinating without difficulty.     Objective: Vital signs in last 24 hours: BP 104/76  Pulse 61  Temp(Src) 97.9 F (36.6 C) (Oral)  Resp 18  Wt 70.308 kg (155 lb)  SpO2 99%  LMP 03/04/2010  Breastfeeding? Unknown  Physical Exam:  General: alert CV: Regular rate and rhythm Resp: clear Abdomen: soft, nontender, normal bowel sounds Lochia: minimal Uterine Fundus: firm, below umbilicus, tender Incision: clean, dry and intact Ext: extremities normal, atraumatic, no cyanosis or edema  Basename 11/24/10 0540  HGB 9.0*  HCT --    Assessment/Plan: 25 y.o. status post Cesarean section POD# 2.  normal postop exam  Routine post-op care D/C home  JACKSON-MOORE,Luman Holway A 11/25/2010, 8:42 AM

## 2010-11-25 NOTE — Discharge Summary (Signed)
Obstetric Discharge Summary Reason for Admission: cesarean section Prenatal Procedures: none Intrapartum Procedures: cesarean: low cervical, transverse Postpartum Procedures: none Complications-Operative and Postpartum: none Hemoglobin  Date Value Range Status  11/24/2010 9.0* 12.0-15.0 (g/dL) Final     HCT  Date Value Range Status  11/15/2010 32.8* 36.0-46.0 (%) Final    Discharge Diagnoses: Term Pregnancy-delivered  Discharge Information: Date: 11/25/2010 Activity: pelvic rest Diet: routine Medications: PNV, Ibuprofen, Iron and Percocet Condition: stable Instructions: See above Discharge to: home Follow-up Information    Follow up with Antionette Char A, MD. Call in 2 weeks.   Contact information:   [address], [address] [place] [address] [phone]          Newborn Data: Live born female  Birth Weight: 6 lb 4.5 oz (2850 g) APGAR: 9,   Home with mother.  JACKSON-MOORE,Allysen Lazo A 11/25/2010, 8:49 AM

## 2010-11-27 ENCOUNTER — Encounter (HOSPITAL_COMMUNITY): Payer: Self-pay | Admitting: Obstetrics & Gynecology

## 2010-12-12 ENCOUNTER — Other Ambulatory Visit: Payer: Self-pay | Admitting: *Deleted

## 2010-12-12 ENCOUNTER — Ambulatory Visit (INDEPENDENT_AMBULATORY_CARE_PROVIDER_SITE_OTHER): Payer: Medicaid Other | Admitting: Infectious Diseases

## 2010-12-12 ENCOUNTER — Encounter: Payer: Self-pay | Admitting: Infectious Diseases

## 2010-12-12 VITALS — BP 127/89 | HR 87 | Temp 98.3°F | Ht 62.0 in | Wt 139.0 lb

## 2010-12-12 DIAGNOSIS — B20 Human immunodeficiency virus [HIV] disease: Secondary | ICD-10-CM

## 2010-12-12 MED ORDER — EMTRICITAB-RILPIVIR-TENOFOV DF 200-25-300 MG PO TABS: 1.0000 | ORAL_TABLET | Freq: Every day | ORAL | Status: DC

## 2010-12-12 NOTE — Assessment & Plan Note (Signed)
She is doing very well. Will change her to complera (inital CD4 510 and VL 62,400). She is instructed to take with food. Will see her back in 4 weeks. Offered condoms.

## 2010-12-12 NOTE — Assessment & Plan Note (Signed)
She appears to be doing well. Will have her seen by Dr Tamela Oddi in f/u.

## 2010-12-12 NOTE — Progress Notes (Signed)
  Subjective:    Patient ID: Jodi Morrow, female    DOB: [birth year], 25 y.o.   MRN: [id number]  HPI 25 yo F who was found to have HIV+ at her last OB visit (acquired HIV during pregnancy). She believes her fiance cheated on her. He has since disclosed his status to her, stating that he had only known for a week. Had previous HIV- on September 05, 2010. Has had Chlamydia before, treated at [facility] hospital (July 2012). She was started on KLT/CBV and had repeat labs 11-18-10, CD4 590 and VL 2920 (initially 98,200). She underwent c-section 11-23-10.  She is doing well today-still has some tenderness at wound. She has some occas bloody d/c vaginally. Baby's first serologies were (-). She is feeding baby formula, no breast feeding. Baby is eating well. Father not involved, pt has moved in with mom. Pt has ? About changing her ART, she wants simpler Rx.     Review of Systems     Objective:   Physical Exam  Constitutional: She appears well-developed and well-nourished.  Eyes: EOM are normal. Pupils are equal, round, and reactive to light.  Neck: Neck supple.  Cardiovascular: Normal rate, regular rhythm and normal heart sounds.   Pulmonary/Chest: Breath sounds normal.  Abdominal: Soft. Bowel sounds are normal.    Lymphadenopathy:    She has no cervical adenopathy.          Assessment & Plan:

## 2011-01-16 ENCOUNTER — Ambulatory Visit (INDEPENDENT_AMBULATORY_CARE_PROVIDER_SITE_OTHER): Payer: Medicaid Other | Admitting: Infectious Diseases

## 2011-01-16 ENCOUNTER — Encounter: Payer: Self-pay | Admitting: Infectious Diseases

## 2011-01-16 VITALS — BP 123/82 | HR 100 | Temp 98.2°F | Ht 62.0 in | Wt 137.0 lb

## 2011-01-16 DIAGNOSIS — B20 Human immunodeficiency virus [HIV] disease: Secondary | ICD-10-CM

## 2011-01-16 DIAGNOSIS — Z23 Encounter for immunization: Secondary | ICD-10-CM

## 2011-01-16 NOTE — Progress Notes (Signed)
Addended by: Wendall Mola A on: 01/16/2011 03:39 PM   Modules accepted: Orders

## 2011-01-16 NOTE — Progress Notes (Signed)
  Subjective:    Patient ID: Jodi Morrow, female    DOB: Nov 26, 1985, 25 y.o.   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 She believes her fiance cheated on her. He has since disclosed his status to her, stating that he had only known for a week. Had previous HIV- on September 05, 2010. Has had Chlamydia before, treated at [facility] hospital (July 2012). She was started on KLT/CBV and had repeat labs 11-18-10, CD4 590 and VL 2920 (initially 98,200). She underwent c-section 11-23-10.  Baby's first and second serologies were (-). She is feeding baby formula, no breast feeding. Baby is eating well, gaining weight. Father not involved, pt has moved in with mom. At her f/u visit 11-2010 she was changed to complera. No problems with this.     Review of Systems  Constitutional: Negative for fever and chills.  Gastrointestinal: Negative for diarrhea and constipation.  Genitourinary: Negative for dysuria.       Objective:   Physical Exam  Constitutional: She appears well-developed and well-nourished.  Eyes: EOM are normal. Pupils are equal, round, and reactive to light.  Neck: Neck supple.  Cardiovascular: Normal rate, regular rhythm and normal heart sounds.   Pulmonary/Chest: Effort normal and breath sounds normal.  Abdominal: Soft. Bowel sounds are normal. There is no tenderness.  Lymphadenopathy:    She has no cervical adenopathy.          Assessment & Plan:

## 2011-01-16 NOTE — Assessment & Plan Note (Signed)
She appears to be doing well and baby appears to be doing well. Will check her CD4 and VL now that she is on complera. As long as these #s look good, she will f/u in 5 months. Will try to link her with THP. fluv and pnvx up to date. Needs to repeat Hep B series.

## 2011-03-21 DIAGNOSIS — D496 Neoplasm of unspecified behavior of brain: Secondary | ICD-10-CM | POA: Insufficient documentation

## 2011-03-22 ENCOUNTER — Emergency Department (HOSPITAL_COMMUNITY): Payer: Medicaid Other

## 2011-03-22 ENCOUNTER — Encounter (HOSPITAL_COMMUNITY): Payer: Self-pay | Admitting: Neurology

## 2011-03-22 ENCOUNTER — Emergency Department (HOSPITAL_COMMUNITY)
Admission: EM | Admit: 2011-03-22 | Discharge: 2011-03-22 | Payer: Medicaid Other | Attending: Emergency Medicine | Admitting: Emergency Medicine

## 2011-03-22 DIAGNOSIS — H53149 Visual discomfort, unspecified: Secondary | ICD-10-CM | POA: Insufficient documentation

## 2011-03-22 DIAGNOSIS — G939 Disorder of brain, unspecified: Secondary | ICD-10-CM | POA: Insufficient documentation

## 2011-03-22 DIAGNOSIS — R51 Headache: Secondary | ICD-10-CM | POA: Insufficient documentation

## 2011-03-22 DIAGNOSIS — Z21 Asymptomatic human immunodeficiency virus [HIV] infection status: Secondary | ICD-10-CM | POA: Insufficient documentation

## 2011-03-22 DIAGNOSIS — R112 Nausea with vomiting, unspecified: Secondary | ICD-10-CM | POA: Insufficient documentation

## 2011-03-22 DIAGNOSIS — H5710 Ocular pain, unspecified eye: Secondary | ICD-10-CM | POA: Insufficient documentation

## 2011-03-22 DIAGNOSIS — G9389 Other specified disorders of brain: Secondary | ICD-10-CM

## 2011-03-22 DIAGNOSIS — B20 Human immunodeficiency virus [HIV] disease: Secondary | ICD-10-CM

## 2011-03-22 HISTORY — DX: Urinary tract infection, site not specified: N39.0

## 2011-03-22 HISTORY — DX: Neoplasm of unspecified behavior of brain: D49.6

## 2011-03-22 HISTORY — DX: Ataxic gait: R26.0

## 2011-03-22 HISTORY — DX: Other abnormalities of gait and mobility: R26.89

## 2011-03-22 LAB — CBC
MCV: 84.4 fL (ref 78.0–100.0)
Platelets: 219 10*3/uL (ref 150–400)
RBC: 4.8 MIL/uL (ref 3.87–5.11)
WBC: 4.2 10*3/uL (ref 4.0–10.5)

## 2011-03-22 LAB — HEPATIC FUNCTION PANEL
AST: 15 U/L (ref 0–37)
Albumin: 4 g/dL (ref 3.5–5.2)
Bilirubin, Direct: <0.1 mg/dL (ref 0.0–0.3)

## 2011-03-22 LAB — URINALYSIS, ROUTINE W REFLEX MICROSCOPIC
Nitrite: POSITIVE — AB
Specific Gravity, Urine: 1.023 (ref 1.005–1.030)
Urobilinogen, UA: 0.2 mg/dL (ref 0.0–1.0)
pH: 6.5 (ref 5.0–8.0)

## 2011-03-22 LAB — BASIC METABOLIC PANEL
Chloride: 102 mEq/L (ref 96–112)
GFR calc Af Amer: >90 mL/min (ref >90)
GFR calc non Af Amer: >90 mL/min (ref >90)
Potassium: 3.8 mEq/L (ref 3.5–5.1)
Sodium: 138 mEq/L (ref 135–145)

## 2011-03-22 LAB — PREGNANCY, URINE: Preg Test, Ur: NEGATIVE

## 2011-03-22 LAB — URINE MICROSCOPIC-ADD ON

## 2011-03-22 MED ORDER — SODIUM CHLORIDE 0.9 % IV SOLN: INTRAVENOUS | Status: DC

## 2011-03-22 MED ORDER — GADOBENATE DIMEGLUMINE 529 MG/ML IV SOLN
13.0000 mL | Freq: Once | INTRAVENOUS | Status: AC
  Administered 2011-03-22: 13 mL via INTRAVENOUS

## 2011-03-22 MED ORDER — HYDROMORPHONE HCL PF 1 MG/ML IJ SOLN
1.0000 mg | Freq: Once | INTRAMUSCULAR | Status: AC
  Administered 2011-03-22: 1 mg via INTRAVENOUS
  Filled 2011-03-22: qty 1

## 2011-03-22 MED ORDER — ONDANSETRON HCL 4 MG/2ML IJ SOLN
4.0000 mg | Freq: Once | INTRAMUSCULAR | Status: AC
  Administered 2011-03-22: 4 mg via INTRAVENOUS
  Filled 2011-03-22: qty 2

## 2011-03-22 MED ORDER — CIPROFLOXACIN IN D5W 400 MG/200ML IV SOLN
400.0000 mg | Freq: Once | INTRAVENOUS | Status: AC
  Administered 2011-03-22: 400 mg via INTRAVENOUS
  Filled 2011-03-22: qty 200

## 2011-03-22 MED ORDER — DEXAMETHASONE SODIUM PHOSPHATE 10 MG/ML IJ SOLN
10.0000 mg | Freq: Once | INTRAMUSCULAR | Status: AC
  Administered 2011-03-22: 10 mg via INTRAVENOUS
  Filled 2011-03-22: qty 1

## 2011-03-22 MED ORDER — DIPHENHYDRAMINE HCL 50 MG/ML IJ SOLN
25.0000 mg | Freq: Once | INTRAMUSCULAR | Status: AC
  Administered 2011-03-22: 25 mg via INTRAVENOUS
  Filled 2011-03-22: qty 1

## 2011-03-22 MED ORDER — SODIUM CHLORIDE 0.9 % IV BOLUS (SEPSIS)
1000.0000 mL | Freq: Once | INTRAVENOUS | Status: AC
  Administered 2011-03-22: 1000 mL via INTRAVENOUS

## 2011-03-22 MED ORDER — PROMETHAZINE HCL 25 MG/ML IJ SOLN
12.5000 mg | INTRAMUSCULAR | Status: AC
  Administered 2011-03-22: 12.5 mg via INTRAVENOUS
  Filled 2011-03-22 (×2): qty 1

## 2011-03-22 NOTE — ED Notes (Signed)
Pt family member at bedside.

## 2011-03-22 NOTE — ED Notes (Signed)
Per ems- pt comes from home, headache for several weeks. Reporting nausea, vomiting yesterday. Reporting sensitivity to light. C/o that her eyes hurt. Pupils equal and reactive to light. Pt ambulated to stretcher without difficulty, alert and oriiented. 124/82, 102, 99% RA, RR 22. Hx positive for migranes.

## 2011-03-22 NOTE — Consult Note (Signed)
Reason for Consult: Brain tumor Referring Physician: EDP  Jodi Morrow is an 26 y.o. female.   HPI:  26 year old HIV-positive female 4 months postpartum who presents with a 3-4 week history of progressive headache. She is developed nausea and vomiting in the last couple of days. She denies any double vision or difficulty swallowing or any numbness in her face. She has had some difficulty with gait and has had a couple of falls. She was ataxic in the emergency department. Head CT and MRI the brain showed ventricular mass and neurosurgical evaluation was requested.  Past Medical History  Diagnosis Date  . Preterm delivery   . HIV (human immunodeficiency virus infection)      diagnosed 08/2010  . Pyelonephritis     1st pregnancy  . Migraine   . Chronic UTI     "since I was 26 year old"  . Stumbling gait   . 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 Cesarean section 11/23/2010    Procedure: CESAREAN SECTION;  Surgeon: Roseanna Rainbow, MD;  Location: [facility];  Service: Gynecology;  Laterality: N/A;  . Tonsillectomy and adenoidectomy ~ 1998    Allergies  Allergen Reactions  . Penicillins Hives and Itching    Pt has tolerated rocephin in past.    History  Substance Use Topics  . Smoking status: Never Smoker   . Smokeless tobacco: Never Used  . Alcohol Use: No    Family History  Problem Relation Age of Onset  . Diabetes Mother   . Cancer Mother   . Thyroid disease Mother   . Hypertension Father   . Cancer Father   . Thyroid disease Father   . Stroke Father      Review of Systems  Positive ROS: neg  All other systems have been reviewed and were otherwise negative with the exception of those mentioned in the HPI and as above.  Objective: Vital signs in last 24 hours: Temp:  [97.5 F (36.4 C)-98.4 F (36.9 C)] 98.4 F (36.9 C) (02/06 1039) Pulse Rate:  [74-92] 74  (02/06 1441) Resp:  [16-20] 16  (02/06 1422) BP:  (103-126)/(68-81) 108/72 mmHg (02/06 1441) SpO2:  [97 %-100 %] 97 % (02/06 1441)  General Appearance: Alert, cooperative, in obvious discomfort, appears stated age Head: Normocephalic, without obvious abnormality, atraumatic Eyes: PERRL, conjunctiva/corneas clear, EOM's intact, no sunsetting sign  Ears: Normal TM's and external ear canals, both ears Throat: benign Neck: Supple, symmetrical, trachea midline, no adenopathy; thyroid: No enlargement/tenderness/nodules; no carotid bruit or JVD Back: Symmetric, no curvature, ROM normal, no CVA tenderness Lungs: Clear to auscultation bilaterally, respirations unlabored Heart: Regular rate and rhythm, S1 and S2 normal, no murmur, rub or gallop Abdomen: Soft, non-tender, bowel sounds active all four quadrants, no masses, no organomegaly Extremities: Extremities normal, atraumatic, no cyanosis or edema Pulses: 2+ and symmetric all extremities Skin: Skin color, texture, turgor normal, no rashes or lesions  NEUROLOGIC:   Mental status: A&O x4, no aphasia, good attention span, Memory and fund of knowledge Motor Exam - grossly normal, normal tone and bulk Sensory Exam - grossly normal Reflexes: symmetric, no pathologic reflexes, No Hoffman's, No clonus Coordination - truncal ataxia Gait - ataxic Balance -poor Cranial Nerves: I: smell Not tested  II: visual acuity  OS: nl    OD: nl  II: visual fields Full to confrontation  II: pupils Equal, round, reactive to light  III,VII: ptosis  None  III,IV,VI: extraocular muscles  Full ROM  V: mastication Normal  V: facial light touch sensation  Normal  V,VII: corneal reflex  Present  VII: facial muscle function - upper  Normal  VII: facial muscle function - lower Normal  VIII: hearing Not tested  IX: soft palate elevation  Normal  IX,X: gag reflex Present  XI: trapezius strength  5/5  XI: sternocleidomastoid strength 5/5  XI: neck flexion strength  5/5  XII: tongue strength  Normal    Data  Review Lab Results  Component Value Date   WBC 4.2 03/22/2011   HGB 13.4 03/22/2011   HCT 40.5 03/22/2011   MCV 84.4 03/22/2011   PLT 219 03/22/2011   Lab Results  Component Value Date   NA 138 03/22/2011   K 3.8 03/22/2011   CL 102 03/22/2011   CO2 26 03/22/2011   BUN 14 03/22/2011   CREATININE 0.77 03/22/2011   GLUCOSE 98 03/22/2011   No results found for this basename: INR, PROTIME    Radiology: No results found. MRI: An enhancing fourth ventricular mass just inferior to the colliculus.  it attaches to the floor of the fourth ventricle with some compression of the floor. There is hydrocephalus.  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 This is a complex tumor involving the floor the fourth ventricle once to the roof, and she has minimal if any neurologic deficit. There may be some very mild weakness of the left face, but this is a soft call. She does have headache and nausea and vomiting. This is a complex lesion that likely requires resection, and is probably best treated at a tertiary care center rather than a community Hospital. I have spoken with physicians at [facility], and they have agreed to accept patient in transfer. She likely will need a ventriculostomy, but I think she is safe for transfer without one at this point. I've explained to her the findings of the MRI, and the reason for transfer to a tertiary care center. He has demonstrated understanding.    JONES,DAVID S 03/22/2011 4:43 PM

## 2011-03-22 NOTE — ED Notes (Signed)
edp in to evaluate patient. Provide update

## 2011-03-22 NOTE — ED Notes (Signed)
Called radiology to get copy of pt scans (MRI, CT)

## 2011-03-22 NOTE — ED Notes (Signed)
Patient transported to MRI 

## 2011-03-22 NOTE — ED Notes (Signed)
Report given to [facility] RN, Onalee Hua. [facility] at bedside to transport patient

## 2011-03-22 NOTE — ED Notes (Signed)
CT/MRI electronically sent to [facility] per radiology.

## 2011-03-22 NOTE — ED Notes (Signed)
[facility] called for pt transport to [facility] ED

## 2011-03-22 NOTE — ED Notes (Signed)
Admitting requesting to hold pain medication until neurosurgeon comes down to see patient

## 2011-03-22 NOTE — Consults (Signed)
Internal Medicine Teaching Note Date: 03/22/2011  Patient name: Jodi Morrow Medical record number: [id number] Date of birth: [birth year] Age: 26 y.o. Gender: female PCP: No primary provider on file.    Chief Complaint: Headache  History of Present Illness: 26 year old woman with well controlled HIV, last CD4 590, presents with headache for three weeks.  Throbbing stabbing pain frontal and occipital.  For the last few days it has been worse than giving birth.  Never had a HA like this before and at baseline rarely has headaches.    She reports weakness in her R hand over this time.  Has had some trouble walking- walks into things in front of her and to her left, feels "dizzy".    No blurry vision.  No double vision.  No facial asymmetry noticed by anyone outside the hospital.   Meds: Medications Prior to Admission  Medication Dose Route Frequency Provider Last Rate Last Dose  . 0.9 %  sodium chloride infusion   Intravenous Continuous Shelda Jakes, MD      . ciprofloxacin (CIPRO) IVPB 400 mg  400 mg Intravenous Once Shelda Jakes, MD   400 mg at 03/22/11 1425  . dexamethasone (DECADRON) injection 10 mg  10 mg Intravenous Once Shelda Jakes, MD   10 mg at 03/22/11 0930  . diphenhydrAMINE (BENADRYL) injection 25 mg  25 mg Intravenous Once Shelda Jakes, MD   25 mg at 03/22/11 0932  . gadobenate dimeglumine (MULTIHANCE) injection 13 mL  13 mL Intravenous Once Medication Radiologist, MD   13 mL at 03/22/11 1330  . HYDROmorphone (DILAUDID) injection 1 mg  1 mg Intravenous Once Shelda Jakes, MD   1 mg at 03/22/11 1111  . HYDROmorphone (DILAUDID) injection 1 mg  1 mg Intravenous Once Leodis Sias, MD      . ondansetron [facility]) injection 4 mg  4 mg Intravenous Once Shelda Jakes, MD   4 mg at 03/22/11 1114  . promethazine (PHENERGAN) injection 12.5 mg  12.5 mg Intravenous To Major Shelda Jakes, MD   12.5 mg at 03/22/11 0934  . sodium chloride 0.9 % bolus  1,000 mL  1,000 mL Intravenous Once Shelda Jakes, MD   1,000 mL at 03/22/11 4098   Medications Prior to Admission  Medication Sig Dispense Refill  . Emtricitab-Rilpivir-Tenofovir 200-25-300 MG TABS Take 1 tablet by mouth daily.  30 tablet  6  . ferrous sulfate 325 (65 FE) MG tablet Take 1 tablet (325 mg total) by mouth 2 (two) times daily with a meal.  60 tablet  5    Allergies: Penicillins Past Medical History  Diagnosis Date  . UTI (urinary tract infection)     recent UTI- resolved  . Preterm delivery   . HIV (human immunodeficiency virus infection)     recently diagnosed  . Pyelonephritis     1st pregnancy  . Migraine    Past Surgical History  Procedure Date  . Tonsillectomy   . Cesarean section 11/23/2010    Procedure: CESAREAN SECTION;  Surgeon: Roseanna Rainbow, MD;  Location: [facility];  Service: Gynecology;  Laterality: N/A;   Family History  Problem Relation Age of Onset  . Diabetes Mother   . Cancer Mother   . Thyroid disease Mother   . Hypertension Father   . Cancer Father   . Thyroid disease Father   . Stroke Father    History   Social History  . Marital Status: Single  Spouse Name: N/A    Number of Children: N/A  . Years of Education: N/A   Occupational History  . Not on file.   Social History Main Topics  . Smoking status: Never Smoker   . Smokeless tobacco: Never Used  . Alcohol Use: No  . Drug Use: No  . Sexually Active: Yes -- Female partner(s)    Birth Control/ Protection: Condom     pt. declined condoms   Other Topics Concern  . Not on file   Social History Narrative  . No narrative on file  76 month old daughter at home.  She says her mother can take care of her daughter while she is receiving care.  Review of Systems: ROS: General: no fevers, chills, changes in weight, changes in appetite Skin: no rash HEENT: no blurry vision, sore throat Pulm: no dyspnea, coughing, wheezing CV: no chest pain, palpitations, shortness of  breath Abd: no abdominal pain, nausea/vomiting, diarrhea/constipation GU: no dysuria, hematuria, polyuria    Physical Exam: Blood pressure 108/72, pulse 74, temperature 98.4 F (36.9 C), temperature source Oral, resp. rate 16, last menstrual period 03/13/2011, SpO2 97.00%, not currently breastfeeding.  General: alert, well-developed, and cooperative to examination.  Head: normocephalic and atraumatic.  Eyes: vision grossly intact, pupils equal, pupils round, pupils reactive to light, no injection and anicteric.  Mouth: pharynx pink and moist, no erythema, and no exudates.  Neck: supple, full ROM, no thyromegaly, no JVD, and no carotid bruits.  Lungs: normal respiratory effort, no accessory muscle use, normal breath sounds, no crackles, and no wheezes. Heart: normal rate, regular rhythm, no murmur, no gallop, and no rub.  Abdomen: soft, non-tender, normal bowel sounds, no distention, no guarding, no rebound tenderness, no hepatomegaly, and no splenomegaly.  Msk: no joint swelling, no joint warmth, and no redness over joints.  Pulses: 2+ DP/PT pulses bilaterally Extremities: No cyanosis, clubbing, edema  Neurologic: alert & oriented X3, cranial nerves II-XII intact, strength 4/5 in R arm, 5/5 elsewhere, sensation intact to light touch and symmetric in upper and lower extremities.  Visual fields grossly intact.  EOMI.    Some swaying instability when standing, but gait grossly normal.  Finger to nose normal.  Heel to shin slightly abnormal.  Skin: turgor normal and no rashes.     Lab results: Basic Metabolic Panel:  [facility] 03/22/11 0915  NA 138  K 3.8  CL 102  CO2 26  GLUCOSE 98  BUN 14  CREATININE 0.77  CALCIUM 9.9  MG --  PHOS --   Liver Function Tests:  Basename 03/22/11 0915  AST 15  ALT 7  ALKPHOS 68  BILITOT 0.4  PROT 7.8  ALBUMIN 4.0   CBC:  Basename 03/22/11 0915  WBC 4.2  NEUTROABS --  HGB 13.4  HCT 40.5  MCV 84.4  PLT 219     Urinalysis:  Basename 03/22/11 1111  COLORURINE YELLOW  LABSPEC 1.023  PHURINE 6.5  GLUCOSEU NEGATIVE  HGBUR NEGATIVE  BILIRUBINUR NEGATIVE  KETONESUR NEGATIVE  PROTEINUR NEGATIVE  UROBILINOGEN 0.2  NITRITE POSITIVE*  LEUKOCYTESUR SMALL*    Imaging results:  Dg Chest 2 View  03/22/2011  *RADIOLOGY REPORT*  Clinical Data: Headache, confusion  CHEST - 2 VIEW  Comparison: None.  Findings: The lungs are clear.  Mediastinal contours appear normal. The heart is within normal limits in size.  No bony abnormality is seen.  IMPRESSION: No active lung disease.  Original Report Authenticated By: Juline Patch, M.D.   Ct Head  Wo Contrast  03/22/2011  *RADIOLOGY REPORT*  Clinical Data: Headache, nausea and vomiting  CT HEAD WITHOUT CONTRAST  Technique:  Contiguous axial images were obtained from the base of the skull through the vertex without contrast.  Comparison: None available  Findings: There is a 2.2 x 2.9 cm  lobular mass within the midline posterior fossa which effaces the fourth ventricle.  There is a subsequent mild to moderate hydronephrosis of the third ventricle and lateral ventricles. Mild edema surrounding the mass within the left and right cerebellum.  No additional lesions are identified.  No intracranial hemorrhage.  Review of the calvarium demonstrates no destructive lesion.  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  Findings conveyed to Dr. Deretha Emory on 03/22/2011 at 11:00 a.m.  Original Report Authenticated By: Genevive Bi, M.D.   Mr Laqueta Jean Wo Contrast  03/22/2011  *RADIOLOGY REPORT*  Clinical Data: Headaches and dizziness.  HIV positive.  Posterior fossa mass.  Abnormal head CT.  MRI HEAD WITHOUT AND WITH CONTRAST  Technique:  Multiplanar, multiecho pulse sequences of the brain and  surrounding structures were obtained according to standard protocol without and with intravenous contrast  Contrast: 13mL MULTIHANCE GADOBENATE DIMEGLUMINE 529 MG/ML IV SOLN  Comparison: Head CT same day  Findings: There is an approximately 2.5 cm diameter tumor either within the fourth ventricle or subependymal to the fourth ventricle.  This is largely solid but does contain some cystic areas.  This fills and expands the fourth ventricle resulting and fourth ventricular obstruction.  There is obstructive hydrocephalus of the lateral and third ventricles with mild transependymal resorption.  No sign of blood products.  No second lesions.  The pituitary gland is normal.  Sinuses, middle ears and mastoids are clear.  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  Differential diagnosis is astrocytoma and  medulloblastoma.  Obstructive hydrocephalus pattern makes this a potential neurosurgical emergency.  Critical Value/emergent results were called by telephone at the time of interpretation on 03/22/2011  at 1430 hours  to  Dr. Deretha Emory, who verbally acknowledged these results.  Original Report Authenticated By: Thomasenia Sales, M.D.     Assessment & Plan by Problem:  Brain Tumor/Obstructing Hydrocephalus: This is almost certainly the cause of her headaches, R hand weakness, and problems walking.  Due to the obstructive nature of this lesion, this finding is emergent.  Neurosurgery has been called, and they suggested the patient will likely need an ED to ED transfer to a tertiary care academic center such as [facility] or [facility].  We are awaiting neurosurgery to come and evaluate the patient, at which point we will follow their expertise/recommendations.     Urinary Tract Infection: Entirely asymptomatic.  No dysuria, hematuria, or change in frequency.  UA shows positive nitrites,  small LE.  No fever or back pain.  No diarrhea.  Pregnancy test negative. Given Ciprofloxacin 400mg  IV once here.  Three days of treatment will likely be sufficient and can be continued wherever she is transferred to pending evaluation by providers there.   Signed: Mohamed Portlock, BRAD 03/22/2011, 4:05 PM

## 2011-03-22 NOTE — ED Provider Notes (Addendum)
History     CSN: [id number]  Arrival date & time 03/22/11  0831   First MD Initiated Contact with Patient 03/22/11 0845      Chief Complaint  Patient presents with  . Headache    (Consider location/radiation/quality/duration/timing/severity/associated sxs/prior treatment) Patient is a 26 y.o. female presenting with headaches. The history is provided by the patient.  Headache  This is a new problem. The current episode started more than 1 week ago (Three-week). The problem occurs constantly. The problem has been gradually worsening. The headache is associated with bright light. The pain is located in the occipital (Involves the left thigh but the entire head hurts mostly in the posterior area) region. The quality of the pain is described as throbbing. The pain is at a severity of 10/10. The pain is severe. The pain does not radiate. Associated symptoms include nausea and vomiting. Pertinent negatives include no anorexia, no fever, no chest pressure, no near-syncope, no orthopnea, no palpitations, no syncope and no shortness of breath. Associated symptoms comments: Photophobia.   Patient with three-week history of waxing and waning headache has been mostly in the subpleural posterior part of the head and associated with the left eye pain had some vomiting yesterday and with nausea today mostly does photophobia has not follow up with her primary care provider Cristal Deer past medical history is significant for HIV infection followed by outpatient clinics here does not have a history of migraines in the past.    Past Medical History  Diagnosis Date  . Preterm delivery   . HIV (human immunodeficiency virus infection)      diagnosed 08/2010  . Pyelonephritis     1st pregnancy  . Migraine   . Chronic UTI     "since I was 26 year old"  . Stumbling gait   . 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 Cesarean section 11/23/2010    Procedure:  CESAREAN SECTION;  Surgeon: Roseanna Rainbow, MD;  Location: [facility];  Service: Gynecology;  Laterality: N/A;  . Tonsillectomy and adenoidectomy ~ 1998    Family History  Problem Relation Age of Onset  . Diabetes Mother   . Cancer Mother   . Thyroid disease Mother   . Hypertension Father   . Cancer Father   . Thyroid disease Father   . Stroke Father     History  Substance Use Topics  . Smoking status: Never Smoker   . Smokeless tobacco: Never Used  . Alcohol Use: No    OB History    Grav Para Term Preterm Abortions TAB SAB Ect Mult Living   3 3 2 1      3       Review of Systems  Constitutional: Negative for fever and chills.  HENT: Negative for congestion, neck pain and neck stiffness.   Respiratory: Negative for shortness of breath.   Cardiovascular: Negative for palpitations, orthopnea, syncope and near-syncope.  Gastrointestinal: Positive for nausea and vomiting. Negative for anorexia.  Genitourinary: Negative for dysuria and hematuria.  Musculoskeletal: Negative for back pain.  Neurological: Positive for dizziness and headaches. Negative for syncope, speech difficulty, weakness and light-headedness.  Hematological: Does not bruise/bleed easily.    Allergies  Penicillins  Home Medications   Current Outpatient Rx  Name Route Sig Dispense Refill  . EMTRICITAB-RILPIVIR-TENOFOVIR 200-25-300 MG PO TABS Oral Take 1 tablet by mouth daily. 30 tablet 6  . FERROUS SULFATE 325 (  65 FE) MG PO TABS Oral Take 1 tablet (325 mg total) by mouth 2 (two) times daily with a meal. 60 tablet 5    BP 108/72  Pulse 74  Temp(Src) 98.4 F (36.9 C) (Oral)  Resp 16  SpO2 97%  LMP 03/13/2011  Breastfeeding? No  Physical Exam  Nursing note and vitals reviewed. Constitutional: She is oriented to person, place, and time. She appears well-developed and well-nourished.  HENT:  Head: Normocephalic and atraumatic.  Mouth/Throat: Oropharynx is clear and moist.  Eyes: Conjunctivae  and EOM are normal.  Neck: Normal range of motion. Neck supple.  Cardiovascular: Normal rate, regular rhythm, normal heart sounds and intact distal pulses.   No murmur heard. Pulmonary/Chest: Effort normal. No respiratory distress.  Abdominal: Soft. Bowel sounds are normal. There is no tenderness.  Musculoskeletal: Normal range of motion. She exhibits no edema and no tenderness.  Neurological: She is alert and oriented to person, place, and time. No cranial nerve deficit. She exhibits normal muscle tone. Coordination normal.  Skin: Skin is warm. No rash noted. No erythema.    ED Course  Procedures (including critical care time)  Labs Reviewed  URINALYSIS, ROUTINE W REFLEX MICROSCOPIC - Abnormal; Notable for the following:    Nitrite POSITIVE (*)    Leukocytes, UA SMALL (*)    All other components within normal limits  URINE MICROSCOPIC-ADD ON - Abnormal; Notable for the following:    Bacteria, UA MANY (*)    All other components within normal limits  CBC  PREGNANCY, URINE  BASIC METABOLIC PANEL  HEPATIC FUNCTION PANEL  URINE CULTURE    Results for orders placed during the hospital encounter of 03/22/11  CBC      Component Value Range   WBC 4.2  4.0 - 10.5 (K/uL)   RBC 4.80  3.87 - 5.11 (MIL/uL)   Hemoglobin 13.4  12.0 - 15.0 (g/dL)   HCT 96.0  45.4 - 09.8 (%)   MCV 84.4  78.0 - 100.0 (fL)   MCH 27.9  26.0 - 34.0 (pg)   MCHC 33.1  30.0 - 36.0 (g/dL)   RDW 11.9  14.7 - 82.9 (%)   Platelets 219  150 - 400 (K/uL)  PREGNANCY, URINE      Component Value Range   Preg Test, Ur NEGATIVE  NEGATIVE   BASIC METABOLIC PANEL      Component Value Range   Sodium 138  135 - 145 (mEq/L)   Potassium 3.8  3.5 - 5.1 (mEq/L)   Chloride 102  96 - 112 (mEq/L)   CO2 26  19 - 32 (mEq/L)   Glucose, Bld 98  70 - 99 (mg/dL)   BUN 14  6 - 23 (mg/dL)   Creatinine, Ser 5.62  0.50 - 1.10 (mg/dL)   Calcium 9.9  8.4 - 13.0 (mg/dL)   GFR calc non Af Amer >90  >90 (mL/min)   GFR calc Af Amer >90   >90 (mL/min)  URINALYSIS, ROUTINE W REFLEX MICROSCOPIC      Component Value Range   Color, Urine YELLOW  YELLOW    APPearance CLEAR  CLEAR    Specific Gravity, Urine 1.023  1.005 - 1.030    pH 6.5  5.0 - 8.0    Glucose, UA NEGATIVE  NEGATIVE (mg/dL)   Hgb urine dipstick NEGATIVE  NEGATIVE    Bilirubin Urine NEGATIVE  NEGATIVE    Ketones, ur NEGATIVE  NEGATIVE (mg/dL)   Protein, ur NEGATIVE  NEGATIVE (mg/dL)   Urobilinogen,  UA 0.2  0.0 - 1.0 (mg/dL)   Nitrite POSITIVE (*) NEGATIVE    Leukocytes, UA SMALL (*) NEGATIVE   HEPATIC FUNCTION PANEL      Component Value Range   Total Protein 7.8  6.0 - 8.3 (g/dL)   Albumin 4.0  3.5 - 5.2 (g/dL)   AST 15  0 - 37 (U/L)   ALT 7  0 - 35 (U/L)   Alkaline Phosphatase 68  39 - 117 (U/L)   Total Bilirubin 0.4  0.3 - 1.2 (mg/dL)   Bilirubin, Direct <1.6  0.0 - 0.3 (mg/dL)   Indirect Bilirubin NOT CALCULATED  0.3 - 0.9 (mg/dL)  URINE MICROSCOPIC-ADD ON      Component Value Range   Squamous Epithelial / LPF RARE  RARE    WBC, UA 3-6  <3 (WBC/hpf)   RBC / HPF 0-2  <3 (RBC/hpf)   Bacteria, UA MANY (*) RARE      Dg Chest 2 View  03/22/2011  *RADIOLOGY REPORT*  Clinical Data: Headache, confusion  CHEST - 2 VIEW  Comparison: None.  Findings: The lungs are clear.  Mediastinal contours appear normal. The heart is within normal limits in size.  No bony abnormality is seen.  IMPRESSION: No active lung disease.  Original Report Authenticated By: Juline Patch, M.D.   Ct Head Wo Contrast  03/22/2011  *RADIOLOGY REPORT*  Clinical Data: Headache, nausea and vomiting  CT HEAD WITHOUT CONTRAST  Technique:  Contiguous axial images were obtained from the base of the skull through the vertex without contrast.  Comparison: None available  Findings: There is a 2.2 x 2.9 cm  lobular mass within the midline posterior fossa which effaces the fourth ventricle.  There is a subsequent mild to moderate hydronephrosis of the third ventricle and lateral ventricles. Mild edema  surrounding the mass within the left and right cerebellum.  No additional lesions are identified.  No intracranial hemorrhage.  Review of the calvarium demonstrates no destructive lesion.  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  Findings conveyed to Dr. Deretha Emory on 03/22/2011 at 11:00 a.m.  Original Report Authenticated By: Genevive Bi, M.D.   Mr Laqueta Jean Wo Contrast  03/22/2011  *RADIOLOGY REPORT*  Clinical Data: Headaches and dizziness.  HIV positive.  Posterior fossa mass.  Abnormal head CT.  MRI HEAD WITHOUT AND WITH CONTRAST  Technique:  Multiplanar, multiecho pulse sequences of the brain and surrounding structures were obtained according to standard protocol without and with intravenous contrast  Contrast: 13mL MULTIHANCE GADOBENATE DIMEGLUMINE 529 MG/ML IV SOLN  Comparison: Head CT same day  Findings: There is an approximately 2.5 cm diameter tumor either within the fourth ventricle or subependymal to the fourth ventricle.  This is largely solid but does contain some cystic areas.  This fills and expands the fourth ventricle resulting and fourth ventricular obstruction.  There is obstructive hydrocephalus of the lateral and third ventricles with mild transependymal resorption.  No sign of blood products.  No second lesions.  The pituitary gland is normal.  Sinuses, middle ears and mastoids are clear.  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  Differential diagnosis is astrocytoma and  medulloblastoma.  Obstructive hydrocephalus pattern makes this a potential neurosurgical emergency.  Critical Value/emergent results were  called by telephone at the time of interpretation on  03/22/2011  at 1430 hours  to  Dr. Deretha Emory, who verbally acknowledged these results.  Original Report Authenticated By: Thomasenia Sales, M.D.     1. Brain mass   2. HIV (human immunodeficiency virus infection)       MDM   Patient with 3 weeks history of headache for which he had new onset migraines but getting worse lately associated with nausea and vomiting sensitive to light. Patient without history of any headaches in the past never was truly diagnosed with migraines not see her primary care provider in the last 3 weeks. Treated in emergency department initially with migraine-type meds a sniffing improvement and given IV dilaudid, with improvement. CT clearly depicted posterior fossa mass with some increased intracranial pressure consult to neurosurgery and to outpatient clinics because of her HIV status rest of workup was negative except for urinary tract infection culture sent for that and patient started on IV Cipro. Admission by either outpatient clinics or neurosurgery.  Agent given the IV Decadron in the emergency department 10 mg as part of a migraine cocktail.  HIV status is fairly stable based on today's workup. Chest x-ray negative for pneumonia.        Shelda Jakes, MD 03/22/11 1729  Shelda Jakes, MD 03/23/11 1230

## 2011-03-22 NOTE — ED Notes (Signed)
Pt told we need urine sample. Provided call light to call when able to [place] restroom

## 2011-03-22 NOTE — ED Notes (Signed)
Admitting at bedside 

## 2011-03-22 NOTE — ED Notes (Signed)
Called Dr. Yetta Barre, reporting will transfer pt to [facility]. Receiving MD is Dr. Leanord Asal.

## 2011-03-22 NOTE — ED Notes (Signed)
Upon transport pt stable. Alert and oriented. No change noted. Vitals stable. Copy of chart printed and given to [facility].

## 2011-03-23 ENCOUNTER — Telehealth: Payer: Self-pay | Admitting: *Deleted

## 2011-03-23 NOTE — Telephone Encounter (Signed)
Rec'd a call from Rachael at [facility]. They asked that I fax all recent notes & labs & genotype to them at [phone]. Office is [phone]. It had been faxed this am. Faxed again

## 2011-03-24 DIAGNOSIS — C716 Malignant neoplasm of cerebellum: Secondary | ICD-10-CM

## 2011-03-24 HISTORY — DX: Malignant neoplasm of cerebellum: C71.6

## 2011-04-03 ENCOUNTER — Ambulatory Visit: Payer: Medicaid Other | Attending: Neurosurgery | Admitting: Physical Therapy

## 2011-04-03 DIAGNOSIS — M6281 Muscle weakness (generalized): Secondary | ICD-10-CM | POA: Insufficient documentation

## 2011-04-03 DIAGNOSIS — R269 Unspecified abnormalities of gait and mobility: Secondary | ICD-10-CM | POA: Insufficient documentation

## 2011-04-03 DIAGNOSIS — IMO0001 Reserved for inherently not codable concepts without codable children: Secondary | ICD-10-CM | POA: Insufficient documentation

## 2011-04-05 ENCOUNTER — Ambulatory Visit: Payer: Medicaid Other | Admitting: Radiation Oncology

## 2011-04-05 ENCOUNTER — Ambulatory Visit: Payer: Medicaid Other

## 2011-04-12 ENCOUNTER — Ambulatory Visit: Payer: Medicaid Other | Admitting: Physical Therapy

## 2011-04-12 ENCOUNTER — Ambulatory Visit: Payer: Medicaid Other | Admitting: Infectious Diseases

## 2011-04-14 ENCOUNTER — Ambulatory Visit: Payer: Medicaid Other | Attending: Neurosurgery | Admitting: Occupational Therapy

## 2011-04-14 ENCOUNTER — Ambulatory Visit: Payer: Medicaid Other | Admitting: Physical Therapy

## 2011-04-14 DIAGNOSIS — R269 Unspecified abnormalities of gait and mobility: Secondary | ICD-10-CM | POA: Insufficient documentation

## 2011-04-14 DIAGNOSIS — IMO0001 Reserved for inherently not codable concepts without codable children: Secondary | ICD-10-CM | POA: Insufficient documentation

## 2011-04-14 DIAGNOSIS — M6281 Muscle weakness (generalized): Secondary | ICD-10-CM | POA: Insufficient documentation

## 2011-04-24 ENCOUNTER — Ambulatory Visit (INDEPENDENT_AMBULATORY_CARE_PROVIDER_SITE_OTHER): Payer: Medicaid Other | Admitting: Infectious Diseases

## 2011-04-24 ENCOUNTER — Encounter: Payer: Self-pay | Admitting: Infectious Diseases

## 2011-04-24 VITALS — BP 129/96 | HR 120 | Temp 98.6°F | Ht 62.0 in | Wt 139.0 lb

## 2011-04-24 DIAGNOSIS — D496 Neoplasm of unspecified behavior of brain: Secondary | ICD-10-CM

## 2011-04-24 DIAGNOSIS — B20 Human immunodeficiency virus [HIV] disease: Secondary | ICD-10-CM

## 2011-04-24 DIAGNOSIS — Z79899 Other long term (current) drug therapy: Secondary | ICD-10-CM

## 2011-04-24 DIAGNOSIS — Z113 Encounter for screening for infections with a predominantly sexual mode of transmission: Secondary | ICD-10-CM

## 2011-04-24 LAB — COMPREHENSIVE METABOLIC PANEL
ALT: <8 U/L (ref 0–35)
AST: 10 U/L (ref 0–37)
CO2: 26 mEq/L (ref 19–32)
Creat: 0.76 mg/dL (ref 0.50–1.10)
Sodium: 139 mEq/L (ref 135–145)
Total Bilirubin: 0.5 mg/dL (ref 0.3–1.2)
Total Protein: 7.7 g/dL (ref 6.0–8.3)

## 2011-04-24 LAB — LIPID PANEL
Cholesterol: 192 mg/dL (ref 0–200)
HDL: 48 mg/dL (ref >39)
LDL Cholesterol: 128 mg/dL — ABNORMAL HIGH (ref 0–99)
Total CHOL/HDL Ratio: 4 Ratio
Triglycerides: 80 mg/dL (ref <150)
VLDL: 16 mg/dL (ref 0–40)

## 2011-04-24 LAB — CBC
MCH: 28.5 pg (ref 26.0–34.0)
MCV: 87.7 fL (ref 78.0–100.0)
Platelets: 322 10*3/uL (ref 150–400)
RBC: 4.78 MIL/uL (ref 3.87–5.11)
RDW: 13.2 % (ref 11.5–15.5)
WBC: 3.8 10*3/uL — ABNORMAL LOW (ref 4.0–10.5)

## 2011-04-24 NOTE — Assessment & Plan Note (Signed)
Await detail son Path from [place].

## 2011-04-24 NOTE — Progress Notes (Signed)
  Subjective:    Patient ID: Jodi Morrow, female    DOB: [birth year], 26 y.o.   MRN: [id number]  HPI 26 yo F who was found to have HIV+ at her  OB visit (acquired HIV during pregnancy). She believes her fiance cheated on her. He has since disclosed his status to her, stating that he had only known for a week. Had previous HIV- on September 05, 2010. Has had Chlamydia before, treated at [facility] hospital (July 2012). She was started on KLT/CBV and had repeat labs 11-18-10, CD4 590 and VL 2920 (initially 98,200). She underwent c-section 11-23-10. She was changed to complera at her ID f/u visit. 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  Differential diagnosis is astrocytoma and  medulloblastoma.  Obstructive hydrocephalus pattern makes this a potential neurosurgical emergency. She was sent to [facility] were she underwent resection 03-24-11  of this leison. She is unclear about what the path showed.  She was changed to ISN/TRV due to concerns that her complera would interact with her XRT.  Today states that the ISN makes her dizzy and sleepy. Her XRT starts on 04-26-11.  Not getting CTX.  States that her baby is doing well, that her mom is helping her take of baby.  Review of Systems  Constitutional: Positive for chills. Negative for fever and unexpected weight change.  Eyes:       Diplopia  Gastrointestinal: Negative for diarrhea and constipation.  Genitourinary: Negative for dysuria.  Neurological: Positive for weakness, numbness and headaches.       R sided weakness since surgery. States she has mini-seizures.        Objective:   Physical Exam  Constitutional: She appears well-nourished.  HENT:       Large, well healed scar on her neck.   Eyes: EOM are normal. Pupils are equal, round, and reactive to light.  Neck: Neck supple.    Cardiovascular: Normal rate, regular rhythm and normal heart sounds.   Pulmonary/Chest: Effort normal and breath sounds normal.  Abdominal: Soft. Bowel sounds are normal. She exhibits no distension. There is no tenderness.  Lymphadenopathy:    She has no cervical adenopathy.  Neurological:       When she grips with R hand she gets tremors in her arm. She has decreased strength in her arm. She uses her L arm to push off to stand up, she has to use her hip muscles to swing her R leg to walk.           Assessment & Plan:

## 2011-04-24 NOTE — Assessment & Plan Note (Signed)
Will check her labs today. Hopefully can change her back to complera when her XRT ends. rtc 3 months

## 2011-04-25 DIAGNOSIS — C716 Malignant neoplasm of cerebellum: Secondary | ICD-10-CM

## 2011-04-26 ENCOUNTER — Ambulatory Visit: Payer: Medicaid Other | Admitting: Physical Therapy

## 2011-04-26 LAB — HIV-1 RNA ULTRAQUANT REFLEX TO GENTYP+: HIV 1 RNA Quant: <20 copies/mL (ref <20)

## 2011-04-28 ENCOUNTER — Encounter: Payer: Medicaid Other | Admitting: Occupational Therapy

## 2011-04-28 ENCOUNTER — Ambulatory Visit: Payer: Medicaid Other | Admitting: Physical Therapy

## 2011-05-03 ENCOUNTER — Other Ambulatory Visit: Payer: Self-pay | Admitting: Licensed Clinical Social Worker

## 2011-05-03 DIAGNOSIS — B20 Human immunodeficiency virus [HIV] disease: Secondary | ICD-10-CM

## 2011-05-03 MED ORDER — EMTRICITABINE-TENOFOVIR DF 200-300 MG PO TABS: 1.0000 | ORAL_TABLET | Freq: Every day | ORAL | Status: DC

## 2011-05-03 MED ORDER — RALTEGRAVIR POTASSIUM 400 MG PO TABS: 400.0000 mg | ORAL_TABLET | Freq: Two times a day (BID) | ORAL | Status: DC

## 2011-05-10 ENCOUNTER — Ambulatory Visit: Payer: Medicaid Other | Admitting: Physical Therapy

## 2011-06-07 ENCOUNTER — Other Ambulatory Visit: Payer: Medicaid Other

## 2011-06-21 ENCOUNTER — Ambulatory Visit: Payer: Medicaid Other | Admitting: Infectious Diseases

## 2011-07-11 ENCOUNTER — Other Ambulatory Visit: Payer: Medicaid Other

## 2011-07-25 ENCOUNTER — Ambulatory Visit: Payer: Medicaid Other | Admitting: Infectious Diseases

## 2011-07-25 ENCOUNTER — Telehealth: Payer: Self-pay | Admitting: *Deleted

## 2011-07-25 NOTE — Telephone Encounter (Signed)
I called her since she missed her appt. I had to leave a message. When she calls back, she needs to reschedule her app with Dr. Ninetta Lights

## 2011-07-27 NOTE — Telephone Encounter (Signed)
She has made an appt

## 2011-08-03 ENCOUNTER — Other Ambulatory Visit: Payer: Medicaid Other

## 2011-08-03 DIAGNOSIS — B20 Human immunodeficiency virus [HIV] disease: Secondary | ICD-10-CM

## 2011-08-03 LAB — CBC
HCT: 37.5 % (ref 36.0–46.0)
MCH: 29.3 pg (ref 26.0–34.0)
MCHC: 33.6 g/dL (ref 30.0–36.0)
MCV: 87.2 fL (ref 78.0–100.0)
Platelets: 221 10*3/uL (ref 150–400)
RDW: 13.9 % (ref 11.5–15.5)

## 2011-08-03 LAB — COMPLETE METABOLIC PANEL WITH GFR
AST: 14 U/L (ref 0–37)
Alkaline Phosphatase: 60 U/L (ref 39–117)
BUN: 14 mg/dL (ref 6–23)
Calcium: 9.2 mg/dL (ref 8.4–10.5)
Creat: 0.84 mg/dL (ref 0.50–1.10)
Total Bilirubin: 0.5 mg/dL (ref 0.3–1.2)

## 2011-08-04 LAB — T-HELPER CELL (CD4) - (RCID CLINIC ONLY)
CD4 % Helper T Cell: 15 % — ABNORMAL LOW (ref 33–55)
CD4 T Cell Abs: 120 uL — ABNORMAL LOW (ref 400–2700)

## 2011-08-16 ENCOUNTER — Encounter: Payer: Self-pay | Admitting: Infectious Diseases

## 2011-08-16 ENCOUNTER — Other Ambulatory Visit: Payer: Self-pay | Admitting: *Deleted

## 2011-08-16 ENCOUNTER — Ambulatory Visit (INDEPENDENT_AMBULATORY_CARE_PROVIDER_SITE_OTHER): Payer: Medicaid Other | Admitting: Infectious Diseases

## 2011-08-16 VITALS — BP 116/78 | HR 96 | Temp 97.8°F | Ht 62.5 in | Wt 133.0 lb

## 2011-08-16 DIAGNOSIS — B20 Human immunodeficiency virus [HIV] disease: Secondary | ICD-10-CM

## 2011-08-16 DIAGNOSIS — C716 Malignant neoplasm of cerebellum: Secondary | ICD-10-CM

## 2011-08-16 DIAGNOSIS — Z23 Encounter for immunization: Secondary | ICD-10-CM

## 2011-08-16 MED ORDER — FERROUS SULFATE 325 (65 FE) MG PO TABS: 325.0000 mg | ORAL_TABLET | Freq: Two times a day (BID) | ORAL | Status: DC

## 2011-08-16 MED ORDER — EMTRICITAB-RILPIVIR-TENOFOV DF 200-25-300 MG PO TABS: 1.0000 | ORAL_TABLET | Freq: Every day | ORAL | Status: DC

## 2011-08-16 NOTE — Assessment & Plan Note (Signed)
Appreciate [facility] neurosurgery. Not sure what her f/u is at this point. She is getting PT for her R sided weakness. Will cont to follow.

## 2011-08-16 NOTE — Assessment & Plan Note (Addendum)
Her CD4 has come down, i suspect related to the XRT. Will change her back to complera (although her CD4 is below the threshold for this rx, she has responded well to it previously and again, i suspect her CD4 drop is due to XRT). Will resume Hep B series.  She is offered/refused condoms. Will see her back in 3 months. Will set her up with PAP.

## 2011-08-16 NOTE — Progress Notes (Signed)
  Subjective:    Patient ID: Jodi Morrow, female    DOB: 01/06/1986, 26 y.o.   MRN: [id number]  HPI 26 yo F who was found to have HIV+ at her OB visit (acquired HIV during pregnancy). Marland Kitchen Has had Chlamydia before, treated at [facility] hospital (July 2012). She was started on KLT/CBV and had repeat labs 11-18-10, CD4 590 and VL 2920 (initially 98,200). She underwent c-section 11-23-10.  She was changed to complera at her f/u. 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  She was changed to ISN/TRV due to concerns that her complera would interact with her XRT She completed XRT May 2. Not getting CTX. Had repeat MRI beginning of June- clean.  Taking ISN/TRV-no problems with ART. Taking zofran still. Wants to go back on complera- TRV makes her dizzy, ISN is "nasty" Baby doing well, will be 106 months old next week.    Review of Systems  Constitutional: Negative for fever, chills and unexpected weight change.       Wt slowly increasing, appetite is increasing.   Gastrointestinal: Negative for diarrhea and constipation.  Genitourinary: Negative for difficulty urinating.       Objective:   Physical Exam  Constitutional: She is oriented to person, place, and time. She appears well-developed and well-nourished.  HENT:  Mouth/Throat: No oropharyngeal exudate.  Eyes: EOM are normal. Pupils are equal, round, and reactive to light.  Neck: Neck supple.  Cardiovascular: Normal rate, regular rhythm and normal heart sounds.   Pulmonary/Chest: Effort normal and breath sounds normal.  Abdominal: Soft. Bowel sounds are normal. She exhibits no distension. There is no tenderness.  Lymphadenopathy:    She has no cervical adenopathy.  Neurological: She is alert and oriented to person, place, and time. She displays  tremor. She exhibits abnormal muscle tone.       Significant tremor RUE, significant weakness RUE and RLE.           Assessment & Plan:

## 2011-08-16 NOTE — Addendum Note (Signed)
Addended by: Wendall Mola A on: 08/16/2011 09:51 AM   Modules accepted: Orders

## 2011-09-08 ENCOUNTER — Ambulatory Visit: Payer: Medicaid Other

## 2011-09-29 ENCOUNTER — Ambulatory Visit (INDEPENDENT_AMBULATORY_CARE_PROVIDER_SITE_OTHER): Payer: Medicaid Other | Admitting: *Deleted

## 2011-09-29 ENCOUNTER — Other Ambulatory Visit (HOSPITAL_COMMUNITY)
Admission: RE | Admit: 2011-09-29 | Discharge: 2011-09-29 | Disposition: A | Discharge status: Home / Self Care | Payer: Medicaid Other | Source: Ambulatory Visit | Attending: Infectious Diseases | Admitting: Infectious Diseases

## 2011-09-29 DIAGNOSIS — Z124 Encounter for screening for malignant neoplasm of cervix: Secondary | ICD-10-CM

## 2011-09-29 DIAGNOSIS — Z01419 Encounter for gynecological examination (general) (routine) without abnormal findings: Secondary | ICD-10-CM | POA: Insufficient documentation

## 2011-09-29 NOTE — Progress Notes (Signed)
  Subjective:     Jodi Morrow is a 25 y.o. woman who comes in today for a  pap smear only.  Previous abnormal Pap smears: no. Contraception: condoms  Objective:     LMP 09/23/11 Pelvic Exam:  Pap smear obtained.   Assessment:    Screening pap smear.   Plan:    Follow up in one year, or as indicated by Pap results. Pt given educational materials re: HIV and women, BSE, PAP smears,, heart disease and HIV, self-esteem and partner safety. Pt given condoms.

## 2011-09-29 NOTE — Patient Instructions (Signed)
Your results will be ready in about a week.  I will mail them to you.  Thank you for coming to the Center today for your care.  Jacquilyn Seldon, RN 

## 2011-10-04 ENCOUNTER — Encounter: Payer: Self-pay | Admitting: *Deleted

## 2011-10-24 ENCOUNTER — Telehealth: Payer: Self-pay | Admitting: *Deleted

## 2011-10-24 NOTE — Telephone Encounter (Signed)
Unable to tell pt PAP smear results.  Phone not accepting messages.  Letter sent to pt address returned as undeliveable.

## 2011-11-07 ENCOUNTER — Other Ambulatory Visit: Payer: Medicaid Other

## 2011-11-07 DIAGNOSIS — B20 Human immunodeficiency virus [HIV] disease: Secondary | ICD-10-CM

## 2011-11-08 LAB — T-HELPER CELL (CD4) - (RCID CLINIC ONLY): CD4 % Helper T Cell: 12 % — ABNORMAL LOW (ref 33–55)

## 2011-11-08 LAB — HIV-1 RNA QUANT-NO REFLEX-BLD: HIV 1 RNA Quant: 5773 copies/mL — ABNORMAL HIGH (ref <20)

## 2011-11-21 ENCOUNTER — Ambulatory Visit: Payer: Medicaid Other | Admitting: Internal Medicine

## 2011-11-23 ENCOUNTER — Ambulatory Visit: Payer: Medicaid Other | Admitting: Infectious Diseases

## 2011-11-24 ENCOUNTER — Ambulatory Visit: Payer: Medicaid Other | Admitting: Infectious Diseases

## 2011-12-19 ENCOUNTER — Other Ambulatory Visit: Payer: Self-pay | Admitting: Licensed Clinical Social Worker

## 2011-12-19 ENCOUNTER — Ambulatory Visit (INDEPENDENT_AMBULATORY_CARE_PROVIDER_SITE_OTHER): Payer: Medicaid Other | Admitting: Infectious Diseases

## 2011-12-19 VITALS — BP 108/69 | HR 105 | Temp 98.2°F | Wt 132.0 lb

## 2011-12-19 DIAGNOSIS — Z79899 Other long term (current) drug therapy: Secondary | ICD-10-CM

## 2011-12-19 DIAGNOSIS — B20 Human immunodeficiency virus [HIV] disease: Secondary | ICD-10-CM

## 2011-12-19 DIAGNOSIS — C716 Malignant neoplasm of cerebellum: Secondary | ICD-10-CM

## 2011-12-19 DIAGNOSIS — Z113 Encounter for screening for infections with a predominantly sexual mode of transmission: Secondary | ICD-10-CM

## 2011-12-19 DIAGNOSIS — Z23 Encounter for immunization: Secondary | ICD-10-CM

## 2011-12-19 MED ORDER — SULFAMETHOXAZOLE-TRIMETHOPRIM 800-160 MG PO TABS: 1.0000 | ORAL_TABLET | ORAL | Status: DC

## 2011-12-19 MED ORDER — EMTRICITAB-RILPIVIR-TENOFOV DF 200-25-300 MG PO TABS: 1.0000 | ORAL_TABLET | Freq: Every day | ORAL | Status: DC

## 2011-12-19 MED ORDER — SULFAMETHOXAZOLE-TRIMETHOPRIM 800-160 MG PO TABS: 1.0000 | ORAL_TABLET | ORAL | Status: AC

## 2011-12-19 MED ORDER — FERROUS SULFATE 325 (65 FE) MG PO TABS: 325.0000 mg | ORAL_TABLET | Freq: Two times a day (BID) | ORAL | Status: DC

## 2011-12-19 NOTE — Assessment & Plan Note (Addendum)
She has dropped her CD4, suspect her adherence is suboptimal. Will refill her meds and see her back for labs in 1 month (reflex to geno). Will start her on bactrim prophylaxis in the mean time. She is not sexually active. Gets flu shot today. Will call her when geno results are available, future appt based on that.

## 2011-12-19 NOTE — Addendum Note (Signed)
Addended by: Starleen Arms D on: 12/19/2011 02:29 PM   Modules accepted: Orders

## 2011-12-19 NOTE — Progress Notes (Signed)
  Subjective:    Patient ID: Nonie Lochner, female    DOB: 21-Feb-1985, 26 y.o.   MRN: [id number]  HPI 26 yo F who was found to have HIV+ at her OB visit (acquired HIV during pregnancy). Marland Kitchen Has had Chlamydia before, treated at [facility] hospital (July 2012). She was started on KLT/CBV and had repeat labs 11-18-10, CD4 590 and VL 2920 (initially 98,200). She underwent c-section 11-23-10.  She was changed to complera at her f/u. 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  She was changed to ISN/TRV due to concerns that her complera would interact with her XRT  She completed XRT Jun 15, 2011.  She was changed back to complera at her previous visit. She has only missed 2 doses of ART prior to her labs. She meets with Pharm today to work on adherence. She has 3 doses of her meds left for this month.  Had [facility] f/u in August. Was cleared. Has occas mild headaches, tremors.    Baby doing well, is 40 year old now.    HIV 1 RNA Quant (copies/mL)  Date Value  11/07/2011 5773*  08/03/2011 168*  04/24/2011 <20      CD4 T Cell Abs (cmm)  Date Value  11/07/2011 80*  08/03/2011 120*  04/24/2011 640       Review of Systems  Constitutional: Negative for appetite change and unexpected weight change.  Gastrointestinal: Negative for diarrhea and constipation.  Genitourinary: Negative for difficulty urinating.  Neurological: Positive for headaches.       Objective:   Physical Exam  Constitutional: She appears well-developed and well-nourished.  HENT:  Mouth/Throat: No oropharyngeal exudate.  Eyes: EOM are normal. Pupils are equal, round, and reactive to light.       Mild droop R eyelid  Neck: Neck supple.  Cardiovascular: Normal rate, regular rhythm and normal heart sounds.   Pulmonary/Chest: Effort normal  and breath sounds normal.  Abdominal: Soft. Bowel sounds are normal. She exhibits no distension. There is no tenderness.  Lymphadenopathy:    She has no cervical adenopathy.  Neurological:       R sided weakness, tremor on right, worse with intention.           Assessment & Plan:

## 2011-12-19 NOTE — Addendum Note (Signed)
Addended by: HATCHER, JEFFREY C on: 12/19/2011 03:25 PM   Modules accepted: Orders

## 2011-12-19 NOTE — Assessment & Plan Note (Signed)
Will get her back in with [facility].

## 2011-12-21 ENCOUNTER — Other Ambulatory Visit: Payer: Self-pay | Admitting: Licensed Clinical Social Worker

## 2011-12-21 DIAGNOSIS — B20 Human immunodeficiency virus [HIV] disease: Secondary | ICD-10-CM

## 2011-12-21 MED ORDER — EMTRICITAB-RILPIVIR-TENOFOV DF 200-25-300 MG PO TABS: 1.0000 | ORAL_TABLET | Freq: Every day | ORAL | Status: DC

## 2012-01-18 ENCOUNTER — Other Ambulatory Visit: Payer: Self-pay | Admitting: Infectious Diseases

## 2012-01-18 ENCOUNTER — Other Ambulatory Visit: Payer: Medicaid Other

## 2012-01-18 DIAGNOSIS — R110 Nausea: Secondary | ICD-10-CM

## 2012-01-18 DIAGNOSIS — Z79899 Other long term (current) drug therapy: Secondary | ICD-10-CM

## 2012-01-18 DIAGNOSIS — B20 Human immunodeficiency virus [HIV] disease: Secondary | ICD-10-CM

## 2012-01-18 DIAGNOSIS — Z113 Encounter for screening for infections with a predominantly sexual mode of transmission: Secondary | ICD-10-CM

## 2012-01-18 MED ORDER — FERROUS SULFATE 325 (65 FE) MG PO TABS: 325.0000 mg | ORAL_TABLET | Freq: Two times a day (BID) | ORAL | Status: DC

## 2012-01-18 MED ORDER — EMTRICITAB-RILPIVIR-TENOFOV DF 200-25-300 MG PO TABS: 1.0000 | ORAL_TABLET | Freq: Every day | ORAL | Status: DC

## 2012-01-18 MED ORDER — ONDANSETRON 4 MG PO TBDP: 4.0000 mg | ORAL_TABLET | Freq: Every day | ORAL | Status: DC

## 2012-01-19 LAB — LIPID PANEL
HDL: 50 mg/dL (ref >39)
LDL Cholesterol: 87 mg/dL (ref 0–99)
Triglycerides: 83 mg/dL (ref <150)
VLDL: 17 mg/dL (ref 0–40)

## 2012-01-19 LAB — COMPLETE METABOLIC PANEL WITH GFR
ALT: <8 U/L (ref 0–35)
AST: 15 U/L (ref 0–37)
Creat: 0.74 mg/dL (ref 0.50–1.10)
GFR, Est African American: >89 mL/min
Total Bilirubin: 0.8 mg/dL (ref 0.3–1.2)

## 2012-01-19 LAB — CBC
MCH: 29.3 pg (ref 26.0–34.0)
MCV: 87.1 fL (ref 78.0–100.0)
Platelets: 210 10*3/uL (ref 150–400)
RDW: 13.6 % (ref 11.5–15.5)

## 2012-01-19 LAB — T-HELPER CELL (CD4) - (RCID CLINIC ONLY): CD4 T Cell Abs: 80 uL — ABNORMAL LOW (ref 400–2700)

## 2012-01-25 LAB — HIV-1 GENOTYPR PLUS

## 2012-01-26 NOTE — Progress Notes (Signed)
Patient scheduled for Monday 01/29/12

## 2012-01-29 ENCOUNTER — Ambulatory Visit (INDEPENDENT_AMBULATORY_CARE_PROVIDER_SITE_OTHER): Payer: Medicaid Other | Admitting: Infectious Diseases

## 2012-01-29 VITALS — BP 120/87 | HR 90 | Temp 97.6°F | Ht 61.0 in | Wt 125.5 lb

## 2012-01-29 DIAGNOSIS — B20 Human immunodeficiency virus [HIV] disease: Secondary | ICD-10-CM

## 2012-01-29 MED ORDER — DRONABINOL 2.5 MG PO CAPS: 2.5000 mg | ORAL_CAPSULE | Freq: Two times a day (BID) | ORAL | Status: DC

## 2012-01-29 MED ORDER — ELVITEG-COBIC-EMTRICIT-TENOFDF 150-150-200-300 MG PO TABS: 1.0000 | ORAL_TABLET | Freq: Every day | ORAL | Status: DC

## 2012-01-29 NOTE — Assessment & Plan Note (Addendum)
Will change her to stibilid to see if she can improve on this. Otherwise will need to take PI. I offered PI to her but she was reluctant due to taking KLT while being pregnant. See back in 6 weeks.

## 2012-01-29 NOTE — Progress Notes (Signed)
  Subjective:    Patient ID: Jodi Morrow, female    DOB: 1986-01-11, 26 y.o.   MRN: [id number]  HPI 26 yo F who was found to have HIV+ at her OB visit (acquired HIV during pregnancy). Marland Kitchen Has had Chlamydia before, treated at [facility] hospital (July 2012). She was started on KLT/CBV and had repeat labs 11-18-10, CD4 590 and VL 2920 (initially 98,200). She underwent c-section 11-23-10.  She was changed to complera at her f/u. 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  She was changed to ISN/TRV due to concerns that her complera would interact with her XRT  She completed XRT Jun 15, 2011.  She was changed back to complera at her previous visit. She has only missed 2 doses of ART prior to her recent labs. She met with Pharm to work on adherence. 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   HIV 1 RNA Quant (copies/mL)  Date Value  01/18/2012 6110*  11/07/2011 5773*  08/03/2011 168*     CD4 T Cell Abs (cmm)  Date Value  01/18/2012 80*  11/07/2011 80*  08/03/2011 120*   Otherwise without complaints.    Review of Systems     Objective:   Physical Exam  Constitutional: She appears well-developed and well-nourished.  HENT:  Mouth/Throat: No oropharyngeal exudate.  Eyes: EOM are normal. Pupils are equal, round, and reactive to light.  Cardiovascular: Normal rate, regular rhythm and normal heart sounds.   Pulmonary/Chest: Effort normal and breath sounds normal. No respiratory distress.  Abdominal: Soft. Bowel sounds are normal. There is no tenderness.          Assessment & Plan:

## 2012-02-22 ENCOUNTER — Other Ambulatory Visit: Payer: Self-pay | Admitting: *Deleted

## 2012-02-22 DIAGNOSIS — B20 Human immunodeficiency virus [HIV] disease: Secondary | ICD-10-CM

## 2012-02-27 ENCOUNTER — Other Ambulatory Visit (INDEPENDENT_AMBULATORY_CARE_PROVIDER_SITE_OTHER): Payer: Medicaid Other

## 2012-02-27 DIAGNOSIS — Z113 Encounter for screening for infections with a predominantly sexual mode of transmission: Secondary | ICD-10-CM

## 2012-02-27 DIAGNOSIS — B20 Human immunodeficiency virus [HIV] disease: Secondary | ICD-10-CM

## 2012-02-27 LAB — CBC WITH DIFFERENTIAL/PLATELET
Eosinophils Relative: 1 % (ref 0–5)
HCT: 38.5 % (ref 36.0–46.0)
Hemoglobin: 13 g/dL (ref 12.0–15.0)
Lymphocytes Relative: 25 % (ref 12–46)
Lymphs Abs: 0.6 10*3/uL — ABNORMAL LOW (ref 0.7–4.0)
MCV: 86.1 fL (ref 78.0–100.0)
Monocytes Absolute: 0.2 10*3/uL (ref 0.1–1.0)
RBC: 4.47 MIL/uL (ref 3.87–5.11)
WBC: 2.5 10*3/uL — ABNORMAL LOW (ref 4.0–10.5)

## 2012-02-28 LAB — COMPLETE METABOLIC PANEL WITH GFR
AST: 15 U/L (ref 0–37)
Alkaline Phosphatase: 51 U/L (ref 39–117)
BUN: 13 mg/dL (ref 6–23)
Creat: 0.79 mg/dL (ref 0.50–1.10)
Potassium: 3.7 mEq/L (ref 3.5–5.3)

## 2012-02-28 LAB — T-HELPER CELL (CD4) - (RCID CLINIC ONLY)
CD4 % Helper T Cell: 12 % — ABNORMAL LOW (ref 33–55)
CD4 T Cell Abs: 80 uL — ABNORMAL LOW (ref 400–2700)

## 2012-03-12 ENCOUNTER — Encounter: Payer: Self-pay | Admitting: Internal Medicine

## 2012-03-12 ENCOUNTER — Ambulatory Visit (INDEPENDENT_AMBULATORY_CARE_PROVIDER_SITE_OTHER): Payer: Medicaid Other | Admitting: Internal Medicine

## 2012-03-12 VITALS — BP 116/79 | HR 96 | Temp 98.1°F | Ht 62.0 in | Wt 128.0 lb

## 2012-03-12 DIAGNOSIS — B20 Human immunodeficiency virus [HIV] disease: Secondary | ICD-10-CM

## 2012-03-12 NOTE — Assessment & Plan Note (Signed)
She seems to be doing well with her Stribild and will continue. Her nausea has resolved with food and so no need for symptomatic therapy. We'll have her check in about 3 months and see her primary provider. She knows if she misses any doses to call sooner.

## 2012-03-12 NOTE — Progress Notes (Signed)
  Subjective:    Patient ID: Jodi Morrow, female    DOB: Jun 25, 1985, 27 y.o.   MRN: [id number]  HPI 27 yo F who was found to have HIV+ at her OB visit (acquired HIV during pregnancy). Marland Kitchen Has had Chlamydia before, treated at [facility] hospital (July 2012). She was started on KLT/CBV and had repeat labs 11-18-10, CD4 590 and VL 2920 (initially 98,200). She underwent c-section 11-23-10.  She was changed to complera at her f/u. 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  She was changed to ISN/TRV due to concerns that her complera would interact with her XRT  She completed XRT Jun 15, 2011.  She was changed back to complera at her previous visit. She has only missed 2 doses of ART prior to her recent labs. She met with Pharm to work on adherence. 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   Last visit, she was seen by her primary provider who started her on Stribild. She comes in today with good compliance though does have some issues with nausea with the Stribild. She however has noted that as long as she eats well she does not have any problems with the medication. She has not had any missed doses. Her viral load now is undetectable and her CD4 count has remained stable at 80. She has no new complaints since starting the medication included no recent hospitalizations.    Review of Systems  Constitutional: Negative for fever, appetite change, fatigue and unexpected weight change.  Gastrointestinal: Positive for nausea. Negative for diarrhea and anal bleeding.  Skin: Negative for rash.  Neurological: Negative for dizziness and headaches.       Objective:   Physical Exam  Constitutional: She appears well-developed and well-nourished. No distress.  Cardiovascular: Normal rate,  regular rhythm and normal heart sounds.           Assessment & Plan:

## 2012-04-16 ENCOUNTER — Other Ambulatory Visit: Payer: Self-pay | Admitting: *Deleted

## 2012-04-16 DIAGNOSIS — R110 Nausea: Secondary | ICD-10-CM

## 2012-04-16 MED ORDER — ONDANSETRON 4 MG PO TBDP: 4.0000 mg | ORAL_TABLET | Freq: Every day | ORAL | Status: DC

## 2012-05-16 ENCOUNTER — Encounter: Payer: Self-pay | Admitting: *Deleted

## 2012-05-22 ENCOUNTER — Other Ambulatory Visit: Payer: Self-pay | Admitting: Infectious Diseases

## 2012-05-22 ENCOUNTER — Other Ambulatory Visit: Payer: Medicaid Other

## 2012-05-22 DIAGNOSIS — B20 Human immunodeficiency virus [HIV] disease: Secondary | ICD-10-CM

## 2012-05-22 LAB — COMPLETE METABOLIC PANEL WITH GFR
ALT: <8 U/L (ref 0–35)
AST: 13 U/L (ref 0–37)
Albumin: 4.4 g/dL (ref 3.5–5.2)
Calcium: 9.3 mg/dL (ref 8.4–10.5)
Chloride: 104 mEq/L (ref 96–112)
Potassium: 4 mEq/L (ref 3.5–5.3)
Sodium: 138 mEq/L (ref 135–145)

## 2012-05-22 LAB — CBC WITH DIFFERENTIAL/PLATELET
Basophils Absolute: 0 10*3/uL (ref 0.0–0.1)
HCT: 37 % (ref 36.0–46.0)
Lymphocytes Relative: 26 % (ref 12–46)
Neutro Abs: 1.8 10*3/uL (ref 1.7–7.7)
Neutrophils Relative %: 63 % (ref 43–77)
Platelets: 235 10*3/uL (ref 150–400)
RDW: 13.8 % (ref 11.5–15.5)
WBC: 2.8 10*3/uL — ABNORMAL LOW (ref 4.0–10.5)

## 2012-05-24 LAB — HIV-1 RNA QUANT-NO REFLEX-BLD: HIV-1 RNA Quant, Log: 1.43 {Log} — ABNORMAL HIGH (ref <1.30)

## 2012-06-05 ENCOUNTER — Ambulatory Visit (INDEPENDENT_AMBULATORY_CARE_PROVIDER_SITE_OTHER): Payer: Medicaid Other | Admitting: Infectious Diseases

## 2012-06-05 ENCOUNTER — Encounter: Payer: Self-pay | Admitting: Infectious Diseases

## 2012-06-05 VITALS — BP 113/80 | HR 101 | Temp 98.6°F | Ht 62.0 in | Wt 128.0 lb

## 2012-06-05 DIAGNOSIS — Z23 Encounter for immunization: Secondary | ICD-10-CM

## 2012-06-05 DIAGNOSIS — C716 Malignant neoplasm of cerebellum: Secondary | ICD-10-CM

## 2012-06-05 DIAGNOSIS — R110 Nausea: Secondary | ICD-10-CM

## 2012-06-05 DIAGNOSIS — B20 Human immunodeficiency virus [HIV] disease: Secondary | ICD-10-CM

## 2012-06-05 MED ORDER — ONDANSETRON 4 MG PO TBDP: 4.0000 mg | ORAL_TABLET | Freq: Every day | ORAL | Status: DC

## 2012-06-05 NOTE — Assessment & Plan Note (Signed)
Appears to be stable, has f/u at [facility]

## 2012-06-05 NOTE — Addendum Note (Signed)
Addended by: Andree Coss on: 06/05/2012 10:54 AM   Modules accepted: Orders

## 2012-06-05 NOTE — Assessment & Plan Note (Signed)
She is doing ok. Incomplete immune reconstitution. Needs 3rd Hep B vax. Offered/refused condoms. Will refill her zofran. Will see her bak in 6 months.

## 2012-06-05 NOTE — Progress Notes (Signed)
  Subjective:    Patient ID: Jodi Morrow, female    DOB: 05/01/1985, 27 y.o.   MRN: [id number]  HPI 27 yo F who was found to have HIV+ at her OB visit (acquired HIV during pregnancy). Marland Kitchen Has had Chlamydia before, treated at [facility] hospital (July 2012). She was started on KLT/CBV and had repeat labs (11-18-10) CD4 590 and VL 2920 (initially 98,200). She underwent c-section 11-23-10.  She was changed to complera at her f/u. She was seen in ED 2-6 with headaches and had MRI showing meduloblastoma. She was sent to [facility] were she underwent resection 03-24-11.  She was changed to ISN/TRV due to concerns that her complera would interact with her XRT  She completed XRT Jun 15, 2011 and was changed back to complera.  She then had a genotype (12-2011) done which showed resistance to all NNRTI's.  She was then changed to stribild. Today is she is without complaints. Had a good easter with her daughter.  Has [facility] f/u July 2014. Occas slight headaches. Has had repeat MRI "that was nothing". Got very dizzy when she took her bactrim and stribilid together, she fell. Now takes 1 hour apart.   HIV 1 RNA Quant (copies/mL)  Date Value  05/22/2012 27*  02/27/2012 <20   01/18/2012 6110*     CD4 T Cell Abs (cmm)  Date Value  05/22/2012 80*  02/27/2012 80*  01/18/2012 80*    Review of Systems  Constitutional: Negative for appetite change and unexpected weight change.  Eyes: Negative for visual disturbance.  Gastrointestinal: Negative for diarrhea and constipation.  Genitourinary: Negative for difficulty urinating and menstrual problem.  Neurological: Positive for dizziness and headaches.  last PAP 09-2011 NL.      Objective:   Physical Exam  Constitutional: She is oriented to person, place, and time. She appears well-developed and well-nourished.  HENT:  Mouth/Throat: No oropharyngeal exudate.  Eyes: EOM are normal. Pupils are equal, round, and reactive to light.  Neck: Neck supple.  Cardiovascular: Normal rate,  regular rhythm and normal heart sounds.   Pulmonary/Chest: Effort normal and breath sounds normal.  Abdominal: Soft. Bowel sounds are normal. There is no tenderness.  Lymphadenopathy:    She has no cervical adenopathy.  Neurological: She is alert and oriented to person, place, and time. She displays tremor.          Assessment & Plan:

## 2012-06-12 ENCOUNTER — Ambulatory Visit: Payer: Medicaid Other | Admitting: Infectious Diseases

## 2012-07-24 ENCOUNTER — Other Ambulatory Visit: Payer: Self-pay | Admitting: Licensed Clinical Social Worker

## 2012-07-24 DIAGNOSIS — B20 Human immunodeficiency virus [HIV] disease: Secondary | ICD-10-CM

## 2012-07-24 MED ORDER — FERROUS SULFATE 325 (65 FE) MG PO TABS: 325.0000 mg | ORAL_TABLET | Freq: Two times a day (BID) | ORAL | Status: DC

## 2012-08-11 IMAGING — CT CT HEAD W/O CM
1 series · 16 of 30 positions shown, 20 images · non-contrast
Comparison: None available

CLINICAL DATA: Headache, nausea and vomiting

CT HEAD WITHOUT CONTRAST
TECHNIQUE: Contiguous axial images were obtained from the base of
the skull through the vertex without contrast.

[Series 3: head routine 4.8 h37s · axial · 0.43mm/px · z∈[+132,+260]mm · 16 of 30 slices shown, 20 images]
[im 2/30  brain]
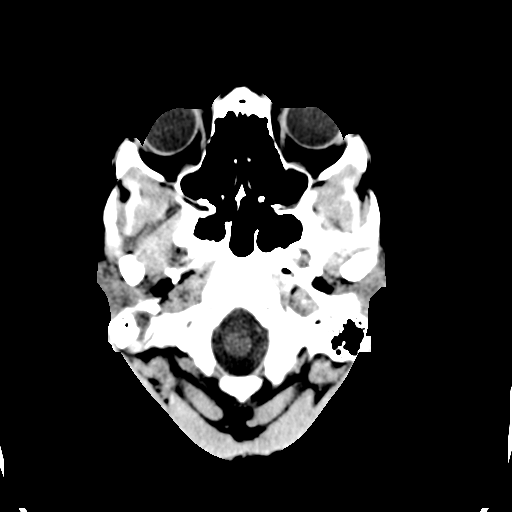
[im 2/30  bone]
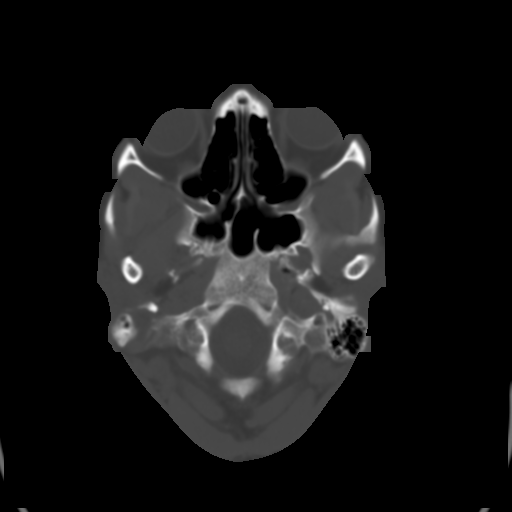
[im 4/30  brain]
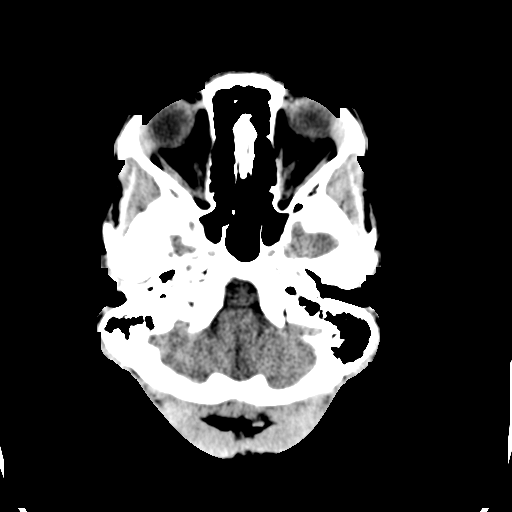
[im 6/30  brain]
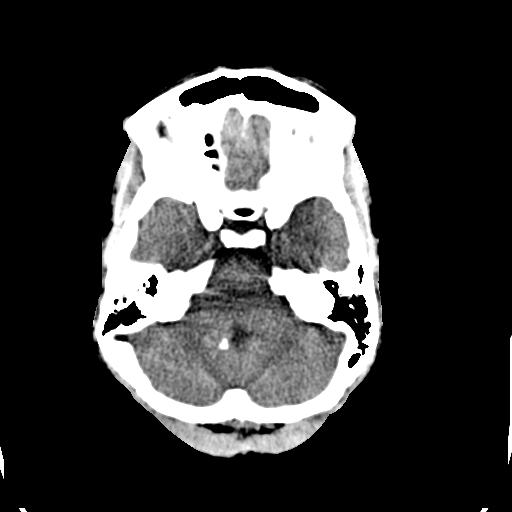
[im 8/30  brain]
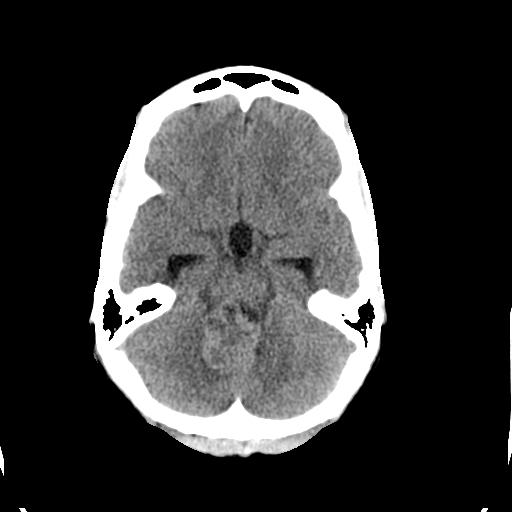
[im 9/30  brain]
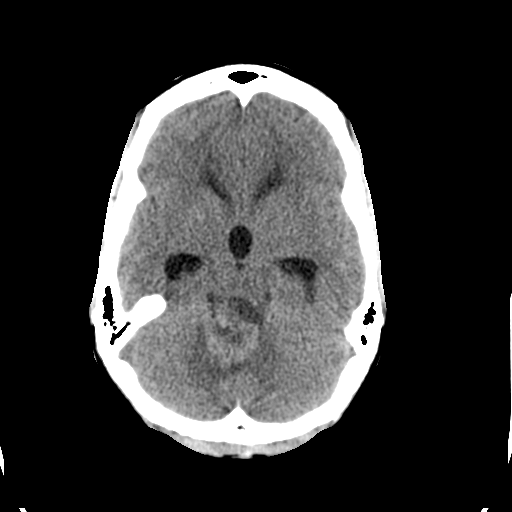
[im 9/30  bone]
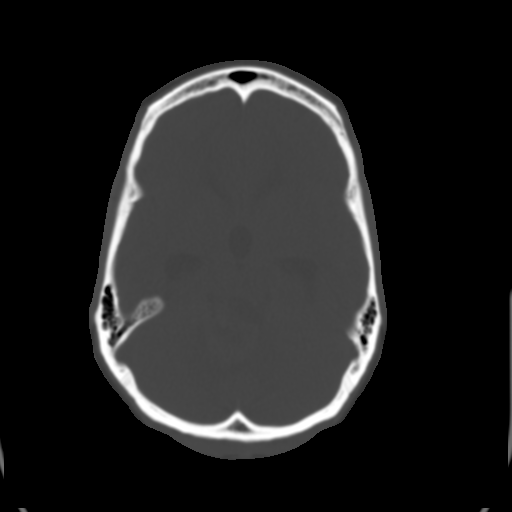
[im 11/30  brain]
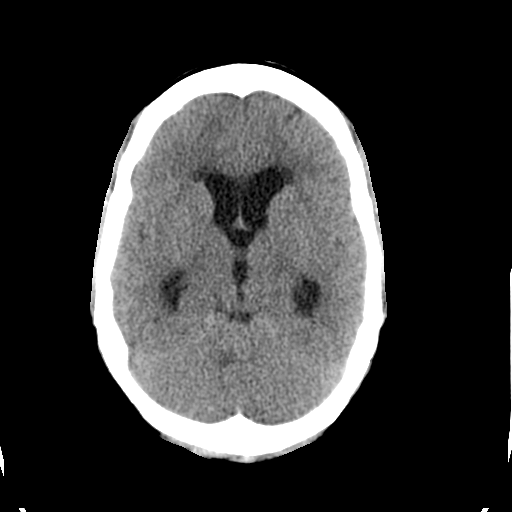
[im 13/30  brain]
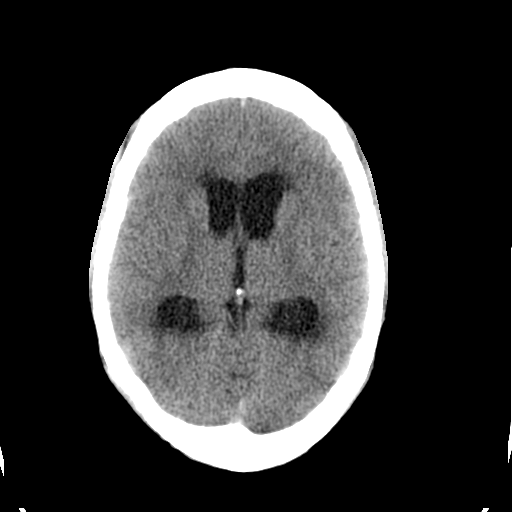
[im 15/30  brain]
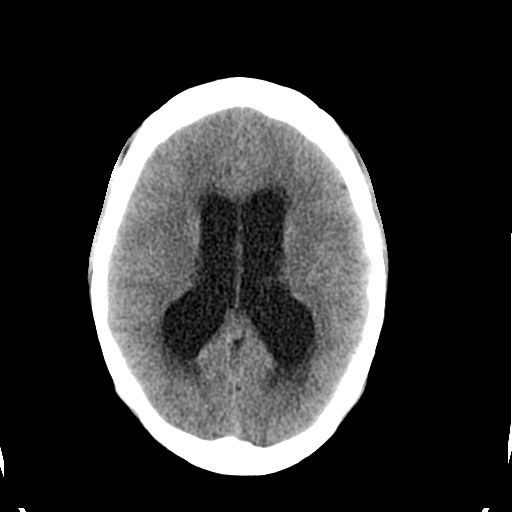
[im 16/30  brain]
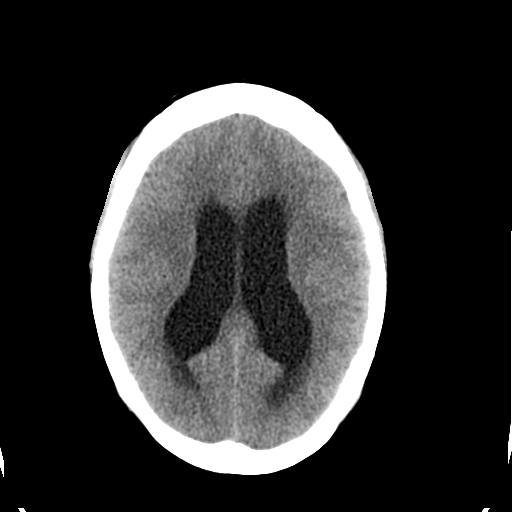
[im 16/30  bone]
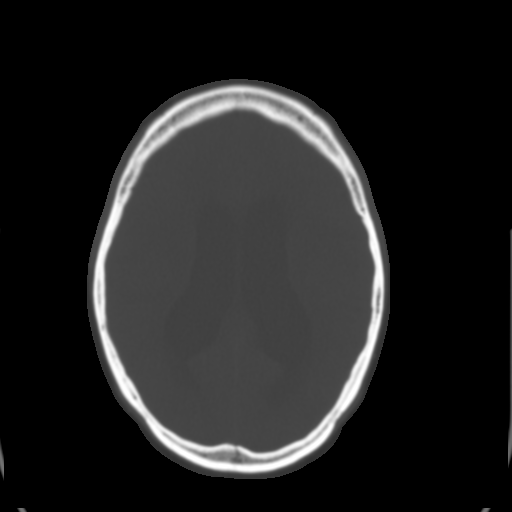
[im 18/30  brain]
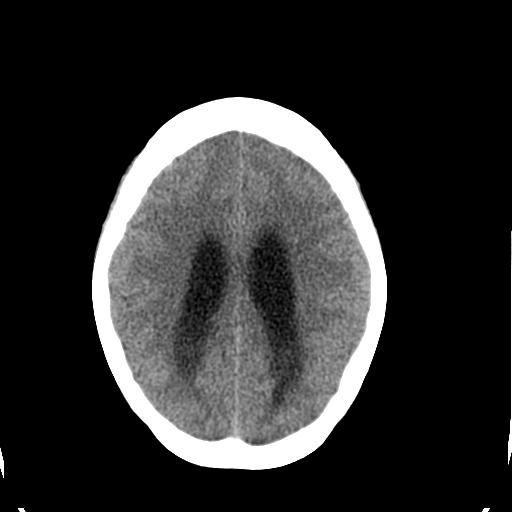
[im 20/30  brain]
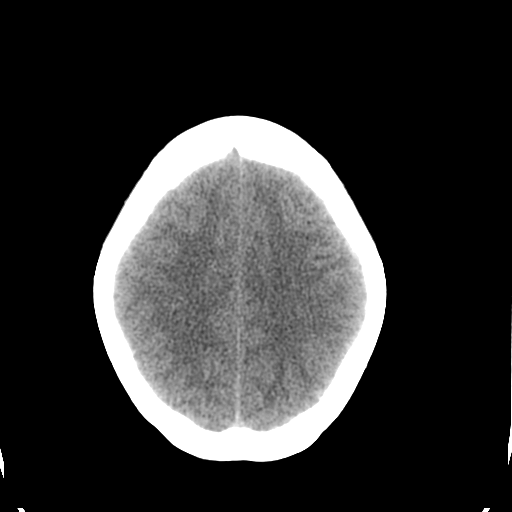
[im 22/30  brain]
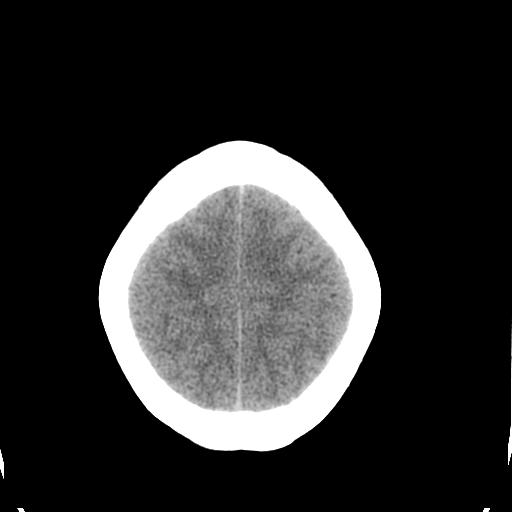
[im 23/30  brain]
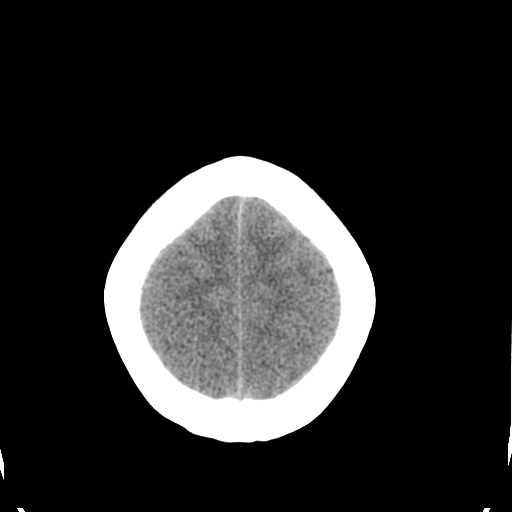
[im 23/30  bone]
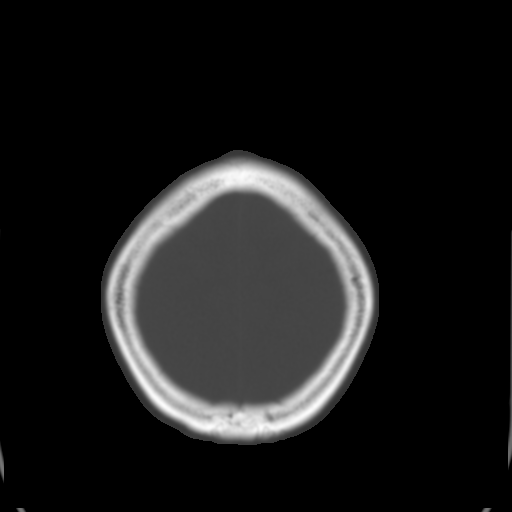
[im 25/30  brain]
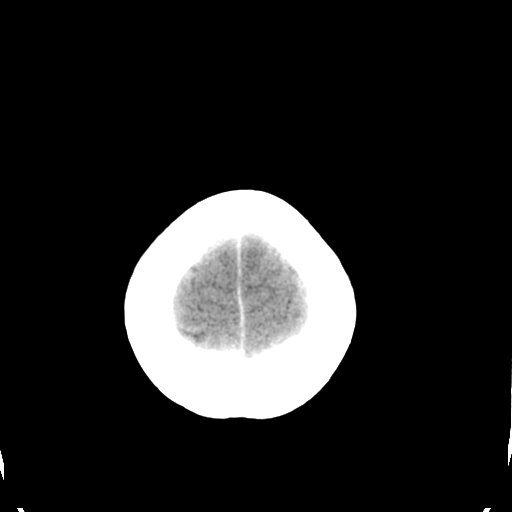
[im 27/30  brain]
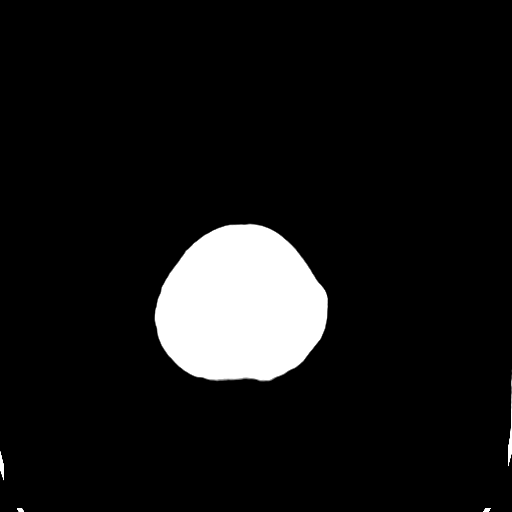
[im 29/30  brain]
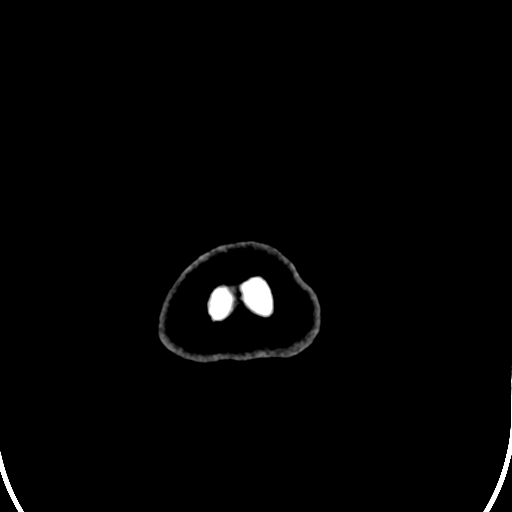

[16 of 30 positions shown; findings below may reference images not displayed]

FINDINGS: There is a 2.2 x 2.9 cm  lobular mass within the midline
posterior fossa which effaces the fourth ventricle.  There is a
subsequent mild to moderate hydronephrosis of the third ventricle
and lateral ventricles. Mild edema surrounding the mass within the
left and right cerebellum.  No additional lesions are identified.

No intracranial hemorrhage.  Review of the calvarium demonstrates
no destructive lesion.
IMPRESSION: Irregular mass within the central posterior fossa which partially
obstructs the fourth ventricle resulting in mild to moderate
noncommunicating  hydrocephalus.Recommend MRI brain with contrast
for further evaluation.

Findings conveyed to Dr. Romanzini on 03/22/2011 at [DATE] a.m.

## 2012-12-02 ENCOUNTER — Other Ambulatory Visit: Payer: Medicaid Other

## 2012-12-16 ENCOUNTER — Ambulatory Visit: Payer: Medicaid Other | Admitting: Infectious Diseases

## 2012-12-16 ENCOUNTER — Telehealth: Payer: Self-pay | Admitting: *Deleted

## 2012-12-16 NOTE — Telephone Encounter (Signed)
Left message notifying patient of missed appointment.  OK to reschedule. Andree Coss, RN

## 2013-01-08 ENCOUNTER — Telehealth: Payer: Self-pay | Admitting: *Deleted

## 2013-01-08 NOTE — Telephone Encounter (Signed)
Needing MD visit and PAP smear.  Requested call office to make appts.

## 2013-01-13 ENCOUNTER — Other Ambulatory Visit: Payer: Self-pay | Admitting: Infectious Diseases

## 2013-02-14 ENCOUNTER — Other Ambulatory Visit: Payer: Self-pay | Admitting: *Deleted

## 2013-02-14 DIAGNOSIS — B20 Human immunodeficiency virus [HIV] disease: Secondary | ICD-10-CM

## 2013-02-14 MED ORDER — FERROUS SULFATE 325 (65 FE) MG PO TABS: 325.0000 mg | ORAL_TABLET | Freq: Two times a day (BID) | ORAL | Status: DC

## 2013-04-07 ENCOUNTER — Other Ambulatory Visit: Payer: Self-pay | Admitting: Infectious Diseases

## 2013-04-07 DIAGNOSIS — B20 Human immunodeficiency virus [HIV] disease: Secondary | ICD-10-CM

## 2013-04-29 ENCOUNTER — Telehealth: Payer: Self-pay | Admitting: *Deleted

## 2013-04-29 NOTE — Telephone Encounter (Signed)
Needing MD, Lab and PAP smear appt

## 2013-05-27 ENCOUNTER — Emergency Department (HOSPITAL_COMMUNITY)
Admission: EM | Admit: 2013-05-27 | Discharge: 2013-05-27 | Disposition: A | Discharge status: Home / Self Care | Payer: Medicaid Other | Attending: Emergency Medicine | Admitting: Emergency Medicine

## 2013-05-27 ENCOUNTER — Emergency Department (HOSPITAL_COMMUNITY): Payer: Medicaid Other

## 2013-05-27 ENCOUNTER — Encounter (HOSPITAL_COMMUNITY): Payer: Self-pay | Admitting: Emergency Medicine

## 2013-05-27 DIAGNOSIS — Z79899 Other long term (current) drug therapy: Secondary | ICD-10-CM | POA: Insufficient documentation

## 2013-05-27 DIAGNOSIS — M545 Low back pain, unspecified: Secondary | ICD-10-CM | POA: Insufficient documentation

## 2013-05-27 DIAGNOSIS — Z8679 Personal history of other diseases of the circulatory system: Secondary | ICD-10-CM | POA: Insufficient documentation

## 2013-05-27 DIAGNOSIS — C716 Malignant neoplasm of cerebellum: Secondary | ICD-10-CM | POA: Insufficient documentation

## 2013-05-27 DIAGNOSIS — R251 Tremor, unspecified: Secondary | ICD-10-CM

## 2013-05-27 DIAGNOSIS — Z86011 Personal history of benign neoplasm of the brain: Secondary | ICD-10-CM | POA: Insufficient documentation

## 2013-05-27 DIAGNOSIS — Z880 Allergy status to penicillin: Secondary | ICD-10-CM | POA: Insufficient documentation

## 2013-05-27 DIAGNOSIS — Z923 Personal history of irradiation: Secondary | ICD-10-CM | POA: Insufficient documentation

## 2013-05-27 DIAGNOSIS — Z21 Asymptomatic human immunodeficiency virus [HIV] infection status: Secondary | ICD-10-CM | POA: Insufficient documentation

## 2013-05-27 DIAGNOSIS — R51 Headache: Secondary | ICD-10-CM

## 2013-05-27 DIAGNOSIS — R259 Unspecified abnormal involuntary movements: Secondary | ICD-10-CM | POA: Insufficient documentation

## 2013-05-27 DIAGNOSIS — R209 Unspecified disturbances of skin sensation: Secondary | ICD-10-CM | POA: Insufficient documentation

## 2013-05-27 DIAGNOSIS — R519 Headache, unspecified: Secondary | ICD-10-CM

## 2013-05-27 DIAGNOSIS — M549 Dorsalgia, unspecified: Secondary | ICD-10-CM

## 2013-05-27 DIAGNOSIS — Z87440 Personal history of urinary (tract) infections: Secondary | ICD-10-CM | POA: Insufficient documentation

## 2013-05-27 LAB — COMPREHENSIVE METABOLIC PANEL
ALT: 7 U/L (ref 0–35)
AST: 17 U/L (ref 0–37)
Albumin: 4.1 g/dL (ref 3.5–5.2)
Alkaline Phosphatase: 61 U/L (ref 39–117)
BILIRUBIN TOTAL: 0.5 mg/dL (ref 0.3–1.2)
BUN: 12 mg/dL (ref 6–23)
CHLORIDE: 98 meq/L (ref 96–112)
CO2: 27 meq/L (ref 19–32)
CREATININE: 0.79 mg/dL (ref 0.50–1.10)
Calcium: 9.6 mg/dL (ref 8.4–10.5)
GFR calc Af Amer: >90 mL/min (ref >90)
Glucose, Bld: 81 mg/dL (ref 70–99)
Potassium: 3.7 mEq/L (ref 3.7–5.3)
Sodium: 137 mEq/L (ref 137–147)
Total Protein: 7.6 g/dL (ref 6.0–8.3)

## 2013-05-27 LAB — CBC
HEMATOCRIT: 37.7 % (ref 36.0–46.0)
HEMOGLOBIN: 12.7 g/dL (ref 12.0–15.0)
MCH: 28.9 pg (ref 26.0–34.0)
MCHC: 33.7 g/dL (ref 30.0–36.0)
MCV: 85.9 fL (ref 78.0–100.0)
Platelets: 238 10*3/uL (ref 150–400)
RBC: 4.39 MIL/uL (ref 3.87–5.11)
RDW: 12.6 % (ref 11.5–15.5)
WBC: 2.3 10*3/uL — ABNORMAL LOW (ref 4.0–10.5)

## 2013-05-27 MED ORDER — GADOBENATE DIMEGLUMINE 529 MG/ML IV SOLN
15.0000 mL | Freq: Once | INTRAVENOUS | Status: AC | PRN
Start: 2013-05-27 — End: 2013-05-27
  Administered 2013-05-27: 12 mL via INTRAVENOUS

## 2013-05-27 MED ORDER — TRAMADOL HCL 50 MG PO TABS: 50.0000 mg | ORAL_TABLET | Freq: Four times a day (QID) | ORAL | Status: DC | PRN

## 2013-05-27 NOTE — ED Notes (Signed)
Patient reports-"tinging in head and legs, and sharp shooting pain up my side along my back with dizziness." Denies LOC and denies falling. Reports right side is always weaker compared to left side and "shakes." Reports seizures on Wednesday and Sunday. Pt is not on seizure medication at this time. Had gamma knife therapy on 03/04/13. This is second treatment. Has had radiation therapy through brain and spine in the past. C/o 10/10 pain in back and head. A&O x4. C/o dizziness but is able to walk with steady gait.

## 2013-05-27 NOTE — ED Notes (Signed)
Patient still c/o 7/10 pain. States she "doesn't want any pain medicine at this time." Aware to call for nurse if pain becomes worse.

## 2013-05-27 NOTE — ED Notes (Signed)
Initial contact - pt to MRI at this time.  NAD upon leaving dept.

## 2013-05-27 NOTE — ED Provider Notes (Signed)
CSN: [id number]     Arrival date & time 05/27/13  1116 History   First MD Initiated Contact with Patient 05/27/13 1148     Chief Complaint  Patient presents with  . Headache  . Seizures     (Consider location/radiation/quality/duration/timing/severity/associated sxs/prior Treatment) Patient is a 28 y.o. female presenting with headaches and seizures. The history is provided by the patient.  Headache Associated symptoms: numbness   Associated symptoms: no abdominal pain, no back pain, no cough, no diarrhea, no pain, no fever, no neck pain, no neck stiffness, no sore throat and no vomiting   Seizures pt with hx hiv, post fossa medulloblastoma, c/o 2 recent episodes entire body shaking. States for 'many months' has episodes right arm shaking lasting a few seconds.  During these episodes remains fully awake and alert, conversant, and able to continue w normal activities. Pt indicates these episodes unchanged, states her doctors have not placed on meds for these symptoms, nor diagnosed w prior seizure.  As relates her medulloblastoma, states 2 prior gamma knife procedures, as well as radiation therapy - last tx 1/15. Pt indicates 6 days ago, and again 2 days ago, while at home, her entire body shook, lasting 20 minutes.  During these episode pt also remained fully awake and alert, able to talk. Did not lose control of self or fall to ground. No trauma. No incontinence.  Pt states frontal headache earlier today, gradual onset, constant, mild. No eye pain or change in vision. No neck pain or stiffness. Pt also notes numbness/tingling sensation to top of scalp and right lower leg, episodes present for 1 week, but lasts seconds. No weakness. Pt also states intermittent low back pain, dull, moderate. Denies injury or strain. No radicular pain. No weakness. No associated gi or gu c/o.  Pt denies fever or chills. States compliant w normal meds, denies med change.      Past Medical History  Diagnosis Date  .  Preterm delivery   . HIV (human immunodeficiency virus infection)      diagnosed 08/2010  . Pyelonephritis     1st pregnancy  . Migraine   . Chronic UTI     "since I was 28 year old"  . Stumbling gait   . Brain tumor     4th ventricle; dx'd 03/22/11  . Medulloblastoma 03/24/11   Past Surgical History  Procedure Laterality Date  . Cesarean section  11/23/2010    Procedure: CESAREAN SECTION;  Surgeon: Agnes Lawrence, MD;  Location: [facility];  Service: Gynecology;  Laterality: N/A;  . Tonsillectomy and adenoidectomy  ~ 1998   Family History  Problem Relation Age of Onset  . Diabetes Mother   . Cancer Mother   . Thyroid disease Mother   . Hypertension Father   . Cancer Father   . Thyroid disease Father   . Stroke Father    History  Substance Use Topics  . Smoking status: Never Smoker   . Smokeless tobacco: Never Used  . Alcohol Use: No   OB History   Grav Para Term Preterm Abortions TAB SAB Ect Mult Living   3 3 2 1      3      Review of Systems  Constitutional: Negative for fever and chills.  HENT: Negative for sore throat.   Eyes: Negative for pain, redness and visual disturbance.  Respiratory: Negative for cough and shortness of breath.   Cardiovascular: Negative for chest pain, palpitations and leg swelling.  Gastrointestinal: Negative for vomiting,  abdominal pain and diarrhea.  Genitourinary: Negative for dysuria and flank pain.  Musculoskeletal: Negative for back pain, neck pain and neck stiffness.  Skin: Negative for rash.  Neurological: Positive for tremors, numbness and headaches. Negative for weakness.  Hematological: Does not bruise/bleed easily.  Psychiatric/Behavioral: Negative for confusion.      Allergies  Penicillins  Home Medications   Prior to Admission medications   Medication Sig Start Date End Date Taking? Authorizing Provider  aspirin 325 MG tablet Take 650 mg by mouth every 4 (four) hours as needed for mild pain.   Yes Historical  Provider, MD  ferrous sulfate 325 (65 FE) MG tablet Take 1 tablet (325 mg total) by mouth 2 (two) times daily with a meal. 02/14/13 02/14/14 Yes Campbell Riches, MD  STRIBILD 150-150-200-300 MG TABS tablet TAKE 1 TABLET BY MOUTH DAILY WITH BREAKFAST 04/07/13  Yes Campbell Riches, MD   BP 120/96  Pulse 79  Temp(Src) 98.9 F (37.2 C) (Oral)  Resp 18  SpO2 100%  LMP 05/14/2013 Physical Exam  Nursing note and vitals reviewed. Constitutional: She is oriented to person, place, and time. She appears well-developed and well-nourished. No distress.  HENT:  Head: Atraumatic.  Nose: Nose normal.  Mouth/Throat: Oropharynx is clear and moist.  No sinus or temporal tenderness.  Eyes: Conjunctivae and EOM are normal. Pupils are equal, round, and reactive to light. No scleral icterus.  Neck: Normal range of motion. Neck supple. No tracheal deviation present. No thyromegaly present.  No stiffness or rigidity.   Cardiovascular: Normal rate, regular rhythm, normal heart sounds and intact distal pulses.  Exam reveals no gallop and no friction rub.   No murmur heard. Pulmonary/Chest: Effort normal and breath sounds normal. No respiratory distress.  Abdominal: Soft. Normal appearance and bowel sounds are normal. She exhibits no distension. There is no tenderness.  Genitourinary:  No cva tenderness.  Musculoskeletal: Normal range of motion. She exhibits no edema and no tenderness.  CTLS spine, non tender, aligned, no step off.   Neurological: She is alert and oriented to person, place, and time. No cranial nerve deficit.  No pronator drift. Motor intact bilaterally. sens grossly intact.  Steady gait.   Skin: Skin is warm and dry. No rash noted. She is not diaphoretic.  Psychiatric: She has a normal mood and affect.    ED Course  Procedures (including critical care time)   Results for orders placed during the hospital encounter of 05/27/13  CBC      Result Value Ref Range   WBC 2.3 (*) 4.0 - 10.5  K/uL   RBC 4.39  3.87 - 5.11 MIL/uL   Hemoglobin 12.7  12.0 - 15.0 g/dL   HCT 37.7  36.0 - 46.0 %   MCV 85.9  78.0 - 100.0 fL   MCH 28.9  26.0 - 34.0 pg   MCHC 33.7  30.0 - 36.0 g/dL   RDW 12.6  11.5 - 15.5 %   Platelets 238  150 - 400 K/uL  COMPREHENSIVE METABOLIC PANEL      Result Value Ref Range   Sodium 137  137 - 147 mEq/L   Potassium 3.7  3.7 - 5.3 mEq/L   Chloride 98  96 - 112 mEq/L   CO2 27  19 - 32 mEq/L   Glucose, Bld 81  70 - 99 mg/dL   BUN 12  6 - 23 mg/dL   Creatinine, Ser 0.79  0.50 - 1.10 mg/dL   Calcium 9.6  8.4 -  10.5 mg/dL   Total Protein 7.6  6.0 - 8.3 g/dL   Albumin 4.1  3.5 - 5.2 g/dL   AST 17  0 - 37 U/L   ALT 7  0 - 35 U/L   Alkaline Phosphatase 61  39 - 117 U/L   Total Bilirubin 0.5  0.3 - 1.2 mg/dL   GFR calc non Af Amer >90  >90 mL/min   GFR calc Af Amer >90  >90 mL/min   Mr Jeri Cos Wo Contrast  05/27/2013   CLINICAL DATA:  History of medulloblastoma post resection. Prior radiation therapy May 2013. Gamma knife treatment January 2015. New onset of seizure. Right frontal headache with dizziness and passing out. Right-sided tingling. HIV. History of migraines.  EXAM: MRI HEAD WITHOUT AND WITH CONTRAST  TECHNIQUE: Multiplanar, multiecho pulse sequences of the brain and surrounding structures were obtained without and with intravenous contrast.  CONTRAST:  34mL MULTIHANCE GADOBENATE DIMEGLUMINE 529 MG/ML IV SOLN  COMPARISON:  03/22/2011 MR.  FINDINGS: No acute infarct.  Prior suboccipital craniotomy for resection of medulloblastoma. Encephalomalacia with small amount of blood breakdown products at the resection site. Additionally, nodular/ linear enhancement along the posterior aspect of the resection site. This represents the patient's first available postoperative examination and therefore I cannot determine if this enhancement represents residual/recurrent tumor or enhancement secondary to treatment effect.  Linear dural enhancement without nodularity. No other  intraparenchymal enhancing lesion is identified.  No intracranial hemorrhage separate from the resection site.  Atrophy most prominent cerebellar region.  No hydrocephalus.  No evidence of mesial temporal sclerosis.  Major intracranial vascular structures are patent.  Partial opacification right mastoid air cells. Right maxillary sinus mild mucosal thickening.  IMPRESSION: Prior suboccipital craniotomy for resection of medulloblastoma. Encephalomalacia with small amount of blood breakdown products at the resection site. Additionally, nodular/ linear enhancement along the posterior aspect of the resection site. This represents the patient's first available postoperative examination and therefore I cannot determine if this enhancement represents residual/recurrent tumor or enhancement secondary to treatment effect.  Atrophy most prominent cerebellar region.  No acute infarct.  Partial opacification right mastoid air cells. Right maxillary sinus mild mucosal thickening.   Electronically Signed   By: Chauncey Cruel M.D.   On: 05/27/2013 14:03      MDM  Iv ns. Labs. Pt reports having mri brain later this month. Given new symptoms, will get today.  Reviewed nursing notes and prior charts for additional history.   Recheck, pt comfortable. No pain. No fevers. Non focal neuro exam.  Pt with episode shaking while in room. Lasted a few seconds, terminating w distracting pt, did not appear c/w seizure.   Given prior episodes shaking/tremors, will refer to neurology follow up.  Pt states she has f/u w her physician at [facility] in the next 1-2 weeks as well.  Pt appears stable for d/c.      Mirna Mires, MD 05/27/13 1444

## 2013-05-27 NOTE — Discharge Instructions (Signed)
For back, try motrin or aleve as need. You may also take ultram as need for pain - no driving when taking ultram. Follow up with your primary care doctor in coming week for recheck.  For shaking episodes, follow up with neurologist in the next 1-2 weeks - see referral - call office to arrange appointment. Also, follow up with your doctors at [facility] this month as planned. Return to ER if worse, new symptoms, fevers, severe pain, numbness/weakness, other concern.    Tension Headache A tension headache is a feeling of pain, pressure, or aching often felt over the front and sides of the head. The pain can be dull or can feel tight (constricting). It is the most common type of headache. Tension headaches are not normally associated with nausea or vomiting and do not get worse with physical activity. Tension headaches can last 30 minutes to several days.  CAUSES  The exact cause is not known, but it may be caused by chemicals and hormones in the brain that lead to pain. Tension headaches often begin after stress, anxiety, or depression. Other triggers may include:  Alcohol.  Caffeine (too much or withdrawal).  Respiratory infections (colds, flu, sinus infections).  Dental problems or teeth clenching.  Fatigue.  Holding your head and neck in one position too long while using a computer. SYMPTOMS   Pressure around the head.   Dull, aching head pain.   Pain felt over the front and sides of the head.   Tenderness in the muscles of the head, neck, and shoulders. DIAGNOSIS  A tension headache is often diagnosed based on:   Symptoms.   Physical examination.   A CT scan or MRI of your head. These tests may be ordered if symptoms are severe or unusual. TREATMENT  Medicines may be given to help relieve symptoms.  HOME CARE INSTRUCTIONS   Only take over-the-counter or prescription medicines for pain or discomfort as directed by your caregiver.   Lie down in a dark, quiet room  when you have a headache.   Keep a journal to find out what may be triggering your headaches. For example, write down:  What you eat and drink.  How much sleep you get.  Any change to your diet or medicines.  Try massage or other relaxation techniques.   Ice packs or heat applied to the head and neck can be used. Use these 3 to 4 times per day for 15 to 20 minutes each time, or as needed.   Limit stress.   Sit up straight, and do not tense your muscles.   Quit smoking if you smoke.  Limit alcohol use.  Decrease the amount of caffeine you drink, or stop drinking caffeine.  Eat and exercise regularly.  Get 7 to 9 hours of sleep, or as recommended by your caregiver.  Avoid excessive use of pain medicine as recurrent headaches can occur.  SEEK MEDICAL CARE IF:   You have problems with the medicines you were prescribed.  Your medicines do not work.  You have a change from the usual headache.  You have nausea or vomiting. SEEK IMMEDIATE MEDICAL CARE IF:   Your headache becomes severe.  You have a fever.  You have a stiff neck.  You have loss of vision.  You have muscular weakness or loss of muscle control.  You lose your balance or have trouble walking.  You feel faint or pass out.  You have severe symptoms that are different from your first symptoms.  MAKE SURE YOU:   Understand these instructions.  Will watch your condition.  Will get help right away if you are not doing well or get worse. Document Released: 01/30/2005 Document Revised: 04/24/2011 Document Reviewed: 01/20/2011 [facility] Patient Information 2014 [place], [place].     Back Pain, Adult Low back pain is very common. About 1 in 5 people have back pain.The cause of low back pain is rarely dangerous. The pain often gets better over time.About half of people with a sudden onset of back pain feel better in just 2 weeks. About 8 in 10 people feel better by 6 weeks.  CAUSES Some common  causes of back pain include:  Strain of the muscles or ligaments supporting the spine.  Wear and tear (degeneration) of the spinal discs.  Arthritis.  Direct injury to the back. DIAGNOSIS Most of the time, the direct cause of low back pain is not known.However, back pain can be treated effectively even when the exact cause of the pain is unknown.Answering your caregiver's questions about your overall health and symptoms is one of the most accurate ways to make sure the cause of your pain is not dangerous. If your caregiver needs more information, he or she may order lab work or imaging tests (X-rays or MRIs).However, even if imaging tests show changes in your back, this usually does not require surgery. HOME CARE INSTRUCTIONS For many people, back pain returns.Since low back pain is rarely dangerous, it is often a condition that people can learn to [facility] their own.   Remain active. It is stressful on the back to sit or stand in one place. Do not sit, drive, or stand in one place for more than 30 minutes at a time. Take short walks on level surfaces as soon as pain allows.Try to increase the length of time you walk each day.  Do not stay in bed.Resting more than 1 or 2 days can delay your recovery.  Do not avoid exercise or work.Your body is made to move.It is not dangerous to be active, even though your back may hurt.Your back will likely heal faster if you return to being active before your pain is gone.  Pay attention to your body when you bend and lift. Many people have less discomfortwhen lifting if they bend their knees, keep the load close to their bodies,and avoid twisting. Often, the most comfortable positions are those that put less stress on your recovering back.  Find a comfortable position to sleep. Use a firm mattress and lie on your side with your knees slightly bent. If you lie on your back, put a pillow under your knees.  Only take over-the-counter or  prescription medicines as directed by your caregiver. Over-the-counter medicines to reduce pain and inflammation are often the most helpful.Your caregiver may prescribe muscle relaxant drugs.These medicines help dull your pain so you can more quickly return to your normal activities and healthy exercise.  Put ice on the injured area.  Put ice in a plastic bag.  Place a towel between your skin and the bag.  Leave the ice on for 15-20 minutes, 03-04 times a day for the first 2 to 3 days. After that, ice and heat may be alternated to reduce pain and spasms.  Ask your caregiver about trying back exercises and gentle massage. This may be of some benefit.  Avoid feeling anxious or stressed.Stress increases muscle tension and can worsen back pain.It is important to recognize when you are anxious or stressed and learn  ways to manage it.Exercise is a great option. SEEK MEDICAL CARE IF:  You have pain that is not relieved with rest or medicine.  You have pain that does not improve in 1 week.  You have new symptoms.  You are generally not feeling well. SEEK IMMEDIATE MEDICAL CARE IF:   You have pain that radiates from your back into your legs.  You develop new bowel or bladder control problems.  You have unusual weakness or numbness in your arms or legs.  You develop nausea or vomiting.  You develop abdominal pain.  You feel faint. Document Released: 01/30/2005 Document Revised: 08/01/2011 Document Reviewed: 06/20/2010 [facility] Patient Information 2014 [place], [place].    Seizure, Adult A seizure is abnormal electrical activity in the brain. Seizures usually last from 30 seconds to 2 minutes. There are various types of seizures. Before a seizure, you may have a warning sensation (aura) that a seizure is about to occur. An aura may include the following symptoms:   Fear or anxiety.  Nausea.  Feeling like the room is spinning (vertigo).  Vision changes, such as seeing  flashing lights or spots. Common symptoms during a seizure include:  A change in attention or behavior (altered mental status).  Convulsions with rhythmic jerking movements.  Drooling.  Rapid eye movements.  Grunting.  Loss of bladder and bowel control.  Bitter taste in the mouth.  Tongue biting. After a seizure, you may feel confused and sleepy. You may also have an injury resulting from convulsions during the seizure. HOME CARE INSTRUCTIONS   If you are given medicines, take them exactly as prescribed by your health care provider.  Keep all follow-up appointments as directed by your health care provider.  Do not swim or drive or engage in risky activity during which a seizure could cause further injury to you or others until your health care provider says it is OK.  Get adequate rest.  Teach friends and family what to do if you have a seizure. They should:  Lay you on the ground to prevent a fall.  Put a cushion under your head.  Loosen any tight clothing around your neck.  Turn you on your side. If vomiting occurs, this helps keep your airway clear.  Stay with you until you recover.  Know whether or not you need emergency care. SEEK IMMEDIATE MEDICAL CARE IF:  The seizure lasts longer than 5 minutes.  The seizure is severe or you do not wake up immediately after the seizure.  You have an altered mental status after the seizure.  You are having more frequent or worsening seizures. Someone should drive you to the emergency department or call local emergency services (911 in [place] MAKE SURE YOU:  Understand these instructions.  Will watch your condition.  Will get help right away if you are not doing well or get worse. Document Released: 01/28/2000 Document Revised: 11/20/2012 Document Reviewed: 09/11/2012 [facility] Patient Information 2014 [place].

## 2013-05-27 NOTE — ED Notes (Signed)
Pt w/ hx of brain cancer c/o lt front headache, dizziness, lt flank, low back pain, tingling in head and rt leg x 1 wk.  States she has had 2 seizures (one on Wednesday and one on Sunday).  Also passed out from dizziness last night.  Last had a gamma knife tx in January.  "They think they got it all but they have not cleared me yet".  Has not sought medical tx since these sx began.

## 2013-06-16 ENCOUNTER — Other Ambulatory Visit: Payer: Self-pay | Admitting: Infectious Diseases

## 2013-08-04 ENCOUNTER — Other Ambulatory Visit: Payer: Self-pay | Admitting: Infectious Diseases

## 2013-10-09 ENCOUNTER — Other Ambulatory Visit: Payer: Self-pay | Admitting: Infectious Diseases

## 2013-10-09 ENCOUNTER — Telehealth: Payer: Self-pay | Admitting: *Deleted

## 2013-10-09 DIAGNOSIS — B20 Human immunodeficiency virus [HIV] disease: Secondary | ICD-10-CM

## 2013-10-09 NOTE — Telephone Encounter (Signed)
Left message asking patient to call for an appointment. Refilled 1 month of Stribild, noted that she needs to be seen for additional refills. Landis Gandy, RN

## 2013-11-06 ENCOUNTER — Other Ambulatory Visit: Payer: Self-pay | Admitting: Infectious Diseases

## 2013-12-15 ENCOUNTER — Encounter (HOSPITAL_COMMUNITY): Payer: Self-pay | Admitting: Emergency Medicine

## 2014-02-03 ENCOUNTER — Other Ambulatory Visit: Payer: Self-pay | Admitting: Infectious Diseases

## 2014-03-12 ENCOUNTER — Other Ambulatory Visit: Payer: Self-pay | Admitting: Infectious Diseases

## 2014-04-28 ENCOUNTER — Telehealth: Payer: Self-pay | Admitting: *Deleted

## 2014-04-28 NOTE — Telephone Encounter (Signed)
Patient last seen in office 05/2012, is overdue for a visit. RN left message for patient, contacted pharmacy to see if they had followed up as well.  Last Stribild picked up 03/12/14. She has not yet requested refill.  They will notify patient as well. Landis Gandy, RN

## 2014-09-07 ENCOUNTER — Telehealth: Payer: Self-pay | Admitting: *Deleted

## 2014-09-07 NOTE — Telephone Encounter (Signed)
Called patient because I noticed she had not been seen in clinic since 05/2012 and we have not given any refills since 02/2014. She said she is in the process of moving to [facility], but wanted to schedule a lab appointment, scheduled for 09/17/14. Asked if she has been off meds since January and she said, "no, I had extras". Advised if she is unable to come here to sign a release to transfer care, she can go to [facility] and sign a release there. She is already seen at [facility] with a different specialist.

## 2014-09-17 ENCOUNTER — Other Ambulatory Visit: Payer: Medicaid Other

## 2015-05-18 ENCOUNTER — Ambulatory Visit: Payer: Medicaid Other | Admitting: Family Medicine

## 2015-12-08 ENCOUNTER — Inpatient Hospital Stay (HOSPITAL_COMMUNITY)
Admission: EM | Admit: 2015-12-08 | Discharge: 2015-12-11 | DRG: 102 | Disposition: A | Discharge status: Home / Self Care | Payer: Medicare Other | Attending: Family Medicine | Admitting: Family Medicine

## 2015-12-08 ENCOUNTER — Emergency Department (HOSPITAL_COMMUNITY): Payer: Medicare Other

## 2015-12-08 ENCOUNTER — Encounter (HOSPITAL_COMMUNITY): Payer: Self-pay

## 2015-12-08 DIAGNOSIS — R51 Headache: Secondary | ICD-10-CM | POA: Diagnosis not present

## 2015-12-08 DIAGNOSIS — B20 Human immunodeficiency virus [HIV] disease: Secondary | ICD-10-CM | POA: Diagnosis not present

## 2015-12-08 DIAGNOSIS — G43009 Migraine without aura, not intractable, without status migrainosus: Secondary | ICD-10-CM | POA: Diagnosis not present

## 2015-12-08 DIAGNOSIS — I959 Hypotension, unspecified: Secondary | ICD-10-CM | POA: Diagnosis present

## 2015-12-08 DIAGNOSIS — Z809 Family history of malignant neoplasm, unspecified: Secondary | ICD-10-CM

## 2015-12-08 DIAGNOSIS — Z923 Personal history of irradiation: Secondary | ICD-10-CM

## 2015-12-08 DIAGNOSIS — Z7982 Long term (current) use of aspirin: Secondary | ICD-10-CM

## 2015-12-08 DIAGNOSIS — R519 Headache, unspecified: Secondary | ICD-10-CM | POA: Diagnosis present

## 2015-12-08 DIAGNOSIS — G629 Polyneuropathy, unspecified: Secondary | ICD-10-CM | POA: Diagnosis present

## 2015-12-08 DIAGNOSIS — Z85841 Personal history of malignant neoplasm of brain: Secondary | ICD-10-CM

## 2015-12-08 DIAGNOSIS — Z8349 Family history of other endocrine, nutritional and metabolic diseases: Secondary | ICD-10-CM

## 2015-12-08 DIAGNOSIS — C716 Malignant neoplasm of cerebellum: Secondary | ICD-10-CM

## 2015-12-08 DIAGNOSIS — Z8249 Family history of ischemic heart disease and other diseases of the circulatory system: Secondary | ICD-10-CM

## 2015-12-08 DIAGNOSIS — R000 Tachycardia, unspecified: Secondary | ICD-10-CM | POA: Diagnosis present

## 2015-12-08 DIAGNOSIS — Z21 Asymptomatic human immunodeficiency virus [HIV] infection status: Secondary | ICD-10-CM | POA: Diagnosis present

## 2015-12-08 DIAGNOSIS — Z823 Family history of stroke: Secondary | ICD-10-CM

## 2015-12-08 DIAGNOSIS — Z23 Encounter for immunization: Secondary | ICD-10-CM

## 2015-12-08 DIAGNOSIS — E861 Hypovolemia: Secondary | ICD-10-CM | POA: Diagnosis present

## 2015-12-08 DIAGNOSIS — Z833 Family history of diabetes mellitus: Secondary | ICD-10-CM

## 2015-12-08 LAB — URINALYSIS, ROUTINE W REFLEX MICROSCOPIC
BILIRUBIN URINE: NEGATIVE
Glucose, UA: NEGATIVE mg/dL
HGB URINE DIPSTICK: NEGATIVE
KETONES UR: NEGATIVE mg/dL
Leukocytes, UA: NEGATIVE
NITRITE: POSITIVE — AB
PH: 8.5 — AB (ref 5.0–8.0)
Protein, ur: NEGATIVE mg/dL
Specific Gravity, Urine: 1.013 (ref 1.005–1.030)

## 2015-12-08 LAB — CSF CELL COUNT WITH DIFFERENTIAL
RBC COUNT CSF: 229 /mm3 — AB
RBC Count, CSF: 610 /mm3 — ABNORMAL HIGH
TUBE #: 1
TUBE #: 4
WBC CSF: 6 /mm3 — AB (ref 0–5)
WBC CSF: 8 /mm3 — AB (ref 0–5)

## 2015-12-08 LAB — CBC WITH DIFFERENTIAL/PLATELET
BASOS ABS: 0 10*3/uL (ref 0.0–0.1)
BASOS PCT: 0 %
Eosinophils Absolute: 0 10*3/uL (ref 0.0–0.7)
Eosinophils Relative: 0 %
HCT: 37 % (ref 36.0–46.0)
Hemoglobin: 12.2 g/dL (ref 12.0–15.0)
Lymphocytes Relative: 8 %
Lymphs Abs: 0.5 10*3/uL — ABNORMAL LOW (ref 0.7–4.0)
MCH: 27.7 pg (ref 26.0–34.0)
MCHC: 33 g/dL (ref 30.0–36.0)
MCV: 83.9 fL (ref 78.0–100.0)
MONO ABS: 0.2 10*3/uL (ref 0.1–1.0)
Monocytes Relative: 4 %
Neutro Abs: 5 10*3/uL (ref 1.7–7.7)
Neutrophils Relative %: 88 %
PLATELETS: 195 10*3/uL (ref 150–400)
RBC: 4.41 MIL/uL (ref 3.87–5.11)
RDW: 13.2 % (ref 11.5–15.5)
WBC: 5.7 10*3/uL (ref 4.0–10.5)

## 2015-12-08 LAB — CRYPTOCOCCAL ANTIGEN, CSF: Crypto Ag: NEGATIVE

## 2015-12-08 LAB — URINE MICROSCOPIC-ADD ON: RBC / HPF: NONE SEEN RBC/hpf (ref 0–5)

## 2015-12-08 LAB — I-STAT CG4 LACTIC ACID, ED: Lactic Acid, Venous: 0.92 mmol/L (ref 0.5–1.9)

## 2015-12-08 LAB — BASIC METABOLIC PANEL
ANION GAP: 7 (ref 5–15)
BUN: 8 mg/dL (ref 6–20)
CALCIUM: 9.2 mg/dL (ref 8.9–10.3)
CO2: 26 mmol/L (ref 22–32)
CREATININE: 0.79 mg/dL (ref 0.44–1.00)
Chloride: 99 mmol/L — ABNORMAL LOW (ref 101–111)
GFR calc Af Amer: >60 mL/min (ref >60)
GFR calc non Af Amer: >60 mL/min (ref >60)
GLUCOSE: 87 mg/dL (ref 65–99)
Potassium: 3.9 mmol/L (ref 3.5–5.1)
Sodium: 132 mmol/L — ABNORMAL LOW (ref 135–145)

## 2015-12-08 LAB — GLUCOSE, CSF: GLUCOSE CSF: 51 mg/dL (ref 40–70)

## 2015-12-08 LAB — PROTEIN, CSF: TOTAL PROTEIN, CSF: 32 mg/dL (ref 15–45)

## 2015-12-08 MED ORDER — HYDROMORPHONE HCL 2 MG/ML IJ SOLN
1.0000 mg | Freq: Once | INTRAMUSCULAR | Status: AC
  Administered 2015-12-08: 1 mg via INTRAVENOUS
  Filled 2015-12-08: qty 1

## 2015-12-08 MED ORDER — SODIUM CHLORIDE 0.9 % IV BOLUS (SEPSIS)
1000.0000 mL | Freq: Once | INTRAVENOUS | Status: AC
  Administered 2015-12-08: 1000 mL via INTRAVENOUS

## 2015-12-08 MED ORDER — ACETAMINOPHEN 325 MG PO TABS
650.0000 mg | ORAL_TABLET | Freq: Once | ORAL | Status: AC
  Administered 2015-12-08: 650 mg via ORAL
  Filled 2015-12-08: qty 2

## 2015-12-08 MED ORDER — ONDANSETRON HCL 4 MG/2ML IJ SOLN
4.0000 mg | Freq: Once | INTRAMUSCULAR | Status: AC
  Administered 2015-12-08: 4 mg via INTRAVENOUS
  Filled 2015-12-08: qty 2

## 2015-12-08 MED ORDER — SULFAMETHOXAZOLE-TRIMETHOPRIM 400-80 MG/5ML IV SOLN: 5.0000 mg/kg | Freq: Once | INTRAVENOUS | Status: DC

## 2015-12-08 MED ORDER — VANCOMYCIN HCL 10 G IV SOLR
1250.0000 mg | Freq: Once | INTRAVENOUS | Status: DC
  Filled 2015-12-08: qty 1250

## 2015-12-08 NOTE — ED Notes (Signed)
Patient not in room

## 2015-12-08 NOTE — ED Provider Notes (Signed)
Patient signed out from Montine Circle, PA-C at end of shift.  Dr. Regenia Skeeter spoke with I[facility] and [facility].  Family Practice to admit patient.   Delos Haring, PA-C 12/08/15 2232    Sherwood Gambler, MD 12/11/15 [phone]

## 2015-12-08 NOTE — ED Notes (Signed)
Placed patient into a gown on the monitor

## 2015-12-08 NOTE — ED Provider Notes (Signed)
[facility] DEPT Provider Note   CSN: [id number] Arrival date & time: 12/08/15  1458     History   Chief Complaint Chief Complaint  Patient presents with  . Headache    HPI Jodi Morrow is a 30 y.o. female.  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  She states that she has had headaches like this before, but never having lasted this long.  She reports some new numbness in her right finger tips, but otherwise denies any numbness, weakness, or tingling.  She reports associated phonophobia and photophobia.  She denies any fevers, chills, or cough.  She has not taken anything for her symptoms.   The history is provided by the patient. No language interpreter was used.    Past Medical History:  Diagnosis Date  . 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 Chronic UTI    "since I was 30 year old"  . HIV (human immunodeficiency virus infection) ([place])     diagnosed 08/2010  . 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 Migraine   . Preterm delivery   . Pyelonephritis    1st pregnancy  . Stumbling gait     Patient Active Problem List   Diagnosis Date Noted  . 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 Anemia complicating pregnancy [id number]  . Cesarean delivery, without mention of indication, delivered, with or without mention of antepartum condition 11/23/2010  . Human immunodeficiency virus (HIV) disease 11/04/2010  . Threatened premature labor 09/11/2010    Past Surgical History:  Procedure Laterality Date  . CESAREAN SECTION  11/23/2010   Procedure: CESAREAN SECTION;  Surgeon: Agnes Lawrence, MD;  Location: [facility];  Service: Gynecology;  Laterality: N/A;  . TONSILLECTOMY AND ADENOIDECTOMY  ~ 1998    OB History    Gravida Para Term Preterm AB Living   3 3 2  1   3    SAB TAB Ectopic Multiple Live Births           1       Home Medications    Prior to Admission medications   Medication Sig Start Date End Date Taking? Authorizing Provider  aspirin 325 MG tablet Take 650 mg by mouth every 4 (four) hours as needed for mild pain.    Historical Provider, MD  ferrous sulfate 325 (65 FE) MG tablet TAKE 1 TABLET (325 MG TOTAL) BY MOUTH TWO   TIMES DAILY WITH A MEAL. 11/06/13   Campbell Riches, MD  STRIBILD 150-150-200-300 MG TABS tablet TAKE 1 TABLET BY MOUTH DAILY WITH BREAKFAST 03/12/14   Campbell Riches, MD  traMADol (ULTRAM) 50 MG tablet Take 1 tablet (50 mg total) by mouth every 6 (six) hours as needed. 05/27/13   Lajean Saver, MD    Family History Family History  Problem Relation Age of Onset  . Diabetes Mother   . Cancer Mother   . Thyroid disease Mother   . Hypertension Father   . Cancer Father   . Thyroid disease Father   . Stroke Father     Social History Social History  Substance Use Topics  . Smoking status: Never Smoker  . Smokeless tobacco: Never Used  . Alcohol use No     Allergies   Penicillins   Review of Systems Review of Systems  Neurological: Positive for headaches.  All other systems reviewed and are negative.    Physical Exam Updated Vital Signs BP 109/73 (BP Location: Right Arm)   Pulse (!) 126   Temp 99.9 F (37.7 C) (Oral)   Resp 20   LMP 11/24/2015   SpO2 100%   Physical Exam  Constitutional: She is oriented to person, place, and time. She appears well-developed and well-nourished.  HENT:  Head: Normocephalic and atraumatic.  Right Ear: External ear normal.  Left Ear: External ear normal.  Eyes: Conjunctivae and EOM are normal. Pupils are equal, round, and reactive to light.  Neck: Normal range of motion. Neck supple.  No pain with neck flexion, no meningismus  Cardiovascular: Regular rhythm and normal heart sounds.  Exam reveals no gallop and no friction rub.   No murmur  heard. tachycardic  Pulmonary/Chest: Effort normal and breath sounds normal. No respiratory distress. She has no wheezes. She has no rales. She exhibits no tenderness.  Abdominal: Soft. She exhibits no distension and no mass. There is no tenderness. There is no rebound and no guarding.  Musculoskeletal: Normal range of motion. She exhibits no edema or tenderness.  Normal gait.  Neurological: She is alert and oriented to person, place, and time. She has normal reflexes.  CN 3-12 intact Speech is clear Movements are goal oriented Sensation and strength intact bilaterally  Skin: Skin is warm and dry.  Psychiatric: She has a normal mood and affect. Her behavior is normal. Judgment and thought content normal.  Nursing note and vitals reviewed.    ED Treatments / Results  Labs (all labs ordered are listed, but only abnormal results are displayed) Labs Reviewed  CBC WITH DIFFERENTIAL/PLATELET  BASIC METABOLIC PANEL    EKG  EKG Interpretation None       Radiology No results found.  Procedures Procedures (including critical care time)  Medications Ordered in ED Medications  HYDROmorphone (DILAUDID) injection 1 mg (not administered)  ondansetron (ZOFRAN) injection 4 mg (not administered)     Initial Impression / Assessment and Plan / ED Course  I have reviewed the triage vital signs and the nursing notes.  Pertinent labs & imaging results that were available during my care of the patient were reviewed by me and considered in my medical decision making (see chart for details).  Clinical Course    5:39 PM Patient seen by and discussed with Dr. Regenia Skeeter, who recommends consultation with neurology.  Neurology reviewed the case with Dr. Regenia Skeeter, and do not see any contraindication to LP.  6:02 PM Discussed the case with Dr. Linus Salmons of infectious disease, who advises holding antibiotics until we get CSF results.  Lumbar puncture performed by myself and Dr. Regenia Skeeter.  After I  was unable to get CSF, Dr. Regenia Skeeter was ultimately successful though it was challenging to obtain on this patient.  Patient's headache is feeling better.    Patient will need to be admitted for headache, fever and HIV with most recent CD4 count of <80.  CSF is pending.  Patient signed out to [place], [place].  Dr. Regenia Skeeter also aware of plan and has seen patient.  Plan:  Follow-up on CSF, if concerning findings consult ID.  ID especially concerned about cryptococcal antigen.  If negative, consider treatment of nitrite positive UTI.  Final Clinical Impressions(s) / ED Diagnoses   Final diagnoses:  None    New Prescriptions New Prescriptions   No medications on file     Montine Circle, PA-C 12/08/15 2002  Sherwood Gambler, MD 12/09/15 [phone]

## 2015-12-08 NOTE — H&P (Signed)
[facility] Admission History and Physical Service Pager: [phone]  Patient name: Jodi Morrow Medical record number: [id number] Date of birth: 03-17-1985 Age: 30 y.o. Gender: female  Primary Care Provider: Pcp Not In System Consultants: Neurology, Infectious Diseases Code Status: Full   Chief Complaint: Headaches  Assessment and Plan: Jodi Morrow is a 30 y.o. 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  #Headaches, acute onset Patient presented with one day history of severe headache that started on Wednesday morning 7/10 quickly become more severe. Patient endorses some photophobia and describes the pain as sharp, starting in the occipital region and quickly became diffused. On exam, neck is supple with full range of motion. Patient had an LP in the ED given her HIV status and fever on presentation. CSF culture shows no organisms, but had a few WBC present less concerning for infectious process such as meningitis given cell count. Cryptococcal antigen in CSF was also negative. UA had a few nitrate but no leukocyte esterase with a few bacteria. She has no urinary symptoms.  Head CT showed  no acute intracranial pathology with recommendation for MRI if clinical concern regarding recurrent or residual neoplasm. Current presentation most consistent with migraines headaches, though patient has an history of brain tumor that was resected. Recent MRI showed tumor remnant s/p resection but no definite disease progression. Patient's infectious work up has been negative so far, will continue to follow up of CSF cultures and labs, but clinical presentation and labs less concerning for infectious process.  --Admit to FMTS, admitting physician Dr.  Dorcas Mcmurray --Give toradol, compazine and benadryl for headache --Will consult Neurology in the am concerning possible repeat MRI given ambiguity of previous read.  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   #Hypotension, acute Patient blood pressure on admission 98/69 despite severe pain. Patient was also tachycardic at 145 most likely 2/2 to hypovolemia. This morning patient BP was 77/47. Patient has been afebrile, but with WBC 5.7 (elevated for her), and she was tachycardic earlier. Patient is meeting SIRS criteria but has no source of infection. --Bolus with NS as needed --Would continue to monitor vitals, assess clinically --Would consider starting broad spectrum antibiotic if worsens clinically --Repeat CXR if clinically warranted  #HIV Last CD4 80. 2014 Will continue current therapy regimen. Of note, mother does not know about the patient's HIV status. --Continue genova 1 tab a day in place of Stribild --Repeat CD4 count  #Medulloblastoma s/p resection Patient with recent MRI at [facility] which no disease progression. Patient mentioned being scheduled for Gamma knoife surgery, but no record were found in care everywhere. Patient with right sided weakness in her lower extremities and tremor her right upper arm and head, sequalae from resection. MRI did suggest post surgical changes vs residual/recurrent tumor in the impression.  --Will try to contact [facility] team for clarification about patient cancer therapy. - consider repeat.   FEN/GI: Saline lock, regular diet  Prophylaxis: SCDs as MRI in CareEverywhere indicated possible microhemorrhages.   Disposition: Admit to  FMTS for observation with pending work up of possible infectious  etiology for headaches  History of Present Illness:  Jodi Morrow is a 30 y.o. 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 According to patient, headache started this morning (10/25) around 10:00 am, headache started at the back of her head and gradually move to the front. She states pain was " 7/10 when it started but progressed to a 12/10". Patient endorsed some photophobia and denies phonophobia with headaches. Patient denies taking anything for her pain. She decided to come to the ED because pain was to severe. Patient has a history of similar headaches, but reports that they do not last long or are not this severe. Patient endorses chest pain and SOB associated with headaches. Patient endorses some nausea but denies vomiting.  Patient reports some numbness and tingling at the tip of her fingers on her right hand for the past few months. She has occasionally been dizzy and confused and has reported her symptoms to her [facility] team. Patient reports multiple fall recently as well with no loss of consciousness or trauma associated with pain. In the ED, patient had a low grade fever received dilaudid 1mg  x2 for her headaches and had a LP done by the ED provider as well as a CXR. Infectious disease was consulted and it was decided to not start her on antibiotics for the time being with negtive crytococcal antigen and no indication for meningitis. Patient also received IVF for hypotension on admission. Of note patient had an MRI Brain at [facility] on 9/28 which showed similar nodular enhancement in the posterior posterior fossa resection cavity compared to 04/28/2015, which may reflect post surgical/posttreatment change versus residual/recurrent tumor. Here were no new lesions with recommendations for close follow up and continued surveillance. Patient reports being scheduled for gamma knife on  November 9 but unclear about the reason at this time.  Review Of Systems: Per HPI with the following additions:   Review of Systems  Constitutional: Positive for chills. Negative for fever and malaise/fatigue.  HENT: Negative for congestion and sore throat.   Eyes: Positive for photophobia. Negative for blurred vision, double vision and redness.  Respiratory: Positive for shortness of breath. Negative for cough and sputum production.   Cardiovascular: Positive for chest pain.  Gastrointestinal: Negative for abdominal pain, diarrhea and nausea.  Genitourinary: Negative for dysuria, frequency, hematuria and urgency.  Musculoskeletal: Positive for back pain, falls and myalgias. Negative for neck pain.  Neurological: Positive for dizziness, tingling, sensory change and headaches. Negative for focal weakness and loss of consciousness.  Endo/Heme/Allergies: Does not bruise/bleed easily.  Psychiatric/Behavioral: Negative for hallucinations.    Patient Active Problem List   Diagnosis Date Noted  . Headache 12/08/2015  . Medulloblastoma ([place]) 03/24/2011  . Anemia complicating pregnancy [id number]  . Cesarean delivery, without mention of indication, delivered, with or without mention of antepartum condition 11/23/2010  . Human immunodeficiency virus (HIV) disease 11/04/2010  . Threatened premature labor 09/11/2010    Past Medical History: Past Medical History:  Diagnosis Date  . Brain tumor ([place])    4th ventricle; dx'd 03/22/11  . Chronic UTI    "since I was 30 year old"  . HIV (human immunodeficiency virus infection) ([place])     diagnosed 08/2010  . Medulloblastoma ([place]) 03/24/11  . Migraine   . Preterm delivery   . Pyelonephritis    1st pregnancy  .  Stumbling gait     Past Surgical History: Past Surgical History:  Procedure Laterality Date  . CESAREAN SECTION  11/23/2010   Procedure: CESAREAN SECTION;  Surgeon: Agnes Lawrence, MD;  Location: [facility];  Service: Gynecology;   Laterality: N/A;  . TONSILLECTOMY AND ADENOIDECTOMY  ~ 1998    Social History: Social History  Substance Use Topics  . Smoking status: Never Smoker  . Smokeless tobacco: Never Used  . Alcohol use No   Additional social history:  Please also refer to relevant sections of EMR.  Family History: Family History  Problem Relation Age of Onset  . Diabetes Mother   . Cancer Mother   . Thyroid disease Mother   . Hypertension Father   . Cancer Father   . Thyroid disease Father   . Stroke Father     Allergies and Medications: Allergies  Allergen Reactions  . Penicillins Hives and Itching    Pt has tolerated rocephin in past. Has patient had a PCN reaction causing immediate rash, facial/tongue/throat swelling, SOB or lightheadedness with hypotension: Yes Has patient had a PCN reaction causing severe rash involving mucus membranes or skin necrosis: No Has patient had a PCN reaction that required hospitalization No Has patient had a PCN reaction occurring within the last 10 years: No If all of the above answers are "NO", then may proceed with Cephalosporin use.    No current facility-administered medications on file prior to encounter.    Current Outpatient Prescriptions on File Prior to Encounter  Medication Sig Dispense Refill  . aspirin 325 MG tablet Take 325-650 mg by mouth every 4 (four) hours as needed for headache.     . ferrous sulfate 325 (65 FE) MG tablet TAKE 1 TABLET (325 MG TOTAL) BY MOUTH TWO   TIMES DAILY WITH A MEAL. 60 tablet 2  . STRIBILD 150-150-200-300 MG TABS tablet TAKE 1 TABLET BY MOUTH DAILY WITH BREAKFAST 30 tablet 0  . traMADol (ULTRAM) 50 MG tablet Take 1 tablet (50 mg total) by mouth every 6 (six) hours as needed. 20 tablet 0    Objective: BP 103/73   Pulse 93   Temp 100.9 F (38.3 C) (Rectal)   Resp 16   Wt 140 lb (63.5 kg)   LMP 11/24/2015   SpO2 98%   BMI 25.61 kg/m  Exam: Physical Exam: General Appearance:  Patient sitting in the dark  eyes are closed, in no acute distress and cooperative and able to answer question, daughter sleeping by her side.  Head/face:  NCAT Eyes:  PERRL and EOMI. Significant aversion to light.  Mouth/Throat:  Mucosa moist, no lesions; pharynx without erythema, edema or exudate. Neck:  Supple, no mass, posterior neck tenderness on palpation around healed incision site (chronic) no nuchal rigidity.  Lungs:  Normal expansion.  Clear to auscultation.  No rales, rhonchi, or wheezing. Heart:  Normal S1, S2.  Regular rate and rhythm without murmur, gallop or rub. Abdomen:  Soft, non-tender, normal bowel sounds; no bruits, organomegaly or masses.  Extremities: Extremities warm to touch, pink, with no edema. and pulses present in all extremities  Musculoskeletal:  Spine range of motion normal. Muscular strength intact in the upper extremities 5/5 and right sided lower extremity weakness 4/5, 5/5 LL extremity. Neurologic:  Alert and oriented x 3, gait normal. reflexes normal and symmetric, strength and  sensation grossly normal Psych exam: Alert,oriented, in NAD with a full range of affect, normal behavior and no psychotic features  Labs and Imaging: CBC  BMET   Recent Labs Lab 12/08/15 1630  WBC 5.7  HGB 12.2  HCT 37.0  PLT 195    Recent Labs Lab 12/08/15 1630  NA 132*  K 3.9  CL 99*  CO2 26  BUN 8  CREATININE 0.79  GLUCOSE 87  CALCIUM 9.2     Crypto Ag negative  CSF: 229 RBCs, 8 WBC, to few to coung other cells, clear, protein 32, glucose 51  Urinalysis    Component Value Date/Time   COLORURINE YELLOW 12/08/2015 1820   APPEARANCEUR CLOUDY (A) 12/08/2015 1820   LABSPEC 1.013 12/08/2015 1820   PHURINE 8.5 (H) 12/08/2015 1820   GLUCOSEU NEGATIVE 12/08/2015 1820   HGBUR NEGATIVE 12/08/2015 1820   BILIRUBINUR NEGATIVE 12/08/2015 1820   KETONESUR NEGATIVE 12/08/2015 1820   PROTEINUR NEGATIVE 12/08/2015 1820   UROBILINOGEN 0.2 03/22/2011 1111   NITRITE POSITIVE (A) 12/08/2015 1820    LEUKOCYTESUR NEGATIVE 12/08/2015 1820   Lactic acid 0.92   Dg Chest 2 View  Result Date: 12/08/2015 CLINICAL DATA:  Chronic headaches.  History of brain tumor. EXAM: CHEST  2 VIEW COMPARISON:  03/22/2011 FINDINGS: The heart size and mediastinal contours are within normal limits. Both lungs are clear. The visualized skeletal structures are unremarkable. IMPRESSION: No active cardiopulmonary disease. Electronically Signed   By: Kathreen Devoid   On: 12/08/2015 17:43   Ct Head Wo Contrast  Result Date: 12/08/2015 CLINICAL DATA:  Frontal headache.  History of medulloblastoma EXAM: CT HEAD WITHOUT CONTRAST TECHNIQUE: Contiguous axial images were obtained from the base of the skull through the vertex without intravenous contrast. COMPARISON:  MR brain 05/27/2013 FINDINGS: Brain: No evidence of acute infarction, hemorrhage, hydrocephalus, extra-axial collection or mass lesion/mass effect. Mild encephalomalacia in the right cerebellum. Vascular: No hyperdense vessel or unexpected calcification. Skull: Prior suboccipital craniotomy. Sinuses/Orbits: Orbits are normal. Severe right maxillary sinus mucosal thickening. Other: None. IMPRESSION: 1. No acute intracranial pathology. 2. 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 If there is further clinical concern regarding recurrent or residual neoplasm, recommend MRI of the brain without and with intravenous contrast. Electronically Signed   By: Kathreen Devoid   On: 12/08/2015 16:12   Marjie Skiff, MD 12/08/2015, 10:34 PM PGY-1, [facility] Intern pager: [phone], text pages welcome  Upper Level Addendum:  I have seen and evaluated this patient along with Dr. Andy Gauss and reviewed the above note, making necessary revisions in purple.   Archie Patten, MD [facility] Resident, PGY-3

## 2015-12-08 NOTE — ED Provider Notes (Signed)
[facility] DEPT Provider Note   CSN: [id number] Arrival date & time: 12/08/15  1458     History   Chief Complaint Chief Complaint  Patient presents with  . Headache    HPI Jodi Morrow is a 30 y.o. female.  HPI  Past Medical History:  Diagnosis Date  . Brain tumor ([place])    4th ventricle; dx'd 03/22/11  . Chronic UTI    "since I was 30 year old"  . HIV (human immunodeficiency virus infection) ([place])     diagnosed 08/2010  . Medulloblastoma ([place]) 03/24/11  . Migraine   . Preterm delivery   . Pyelonephritis    1st pregnancy  . Stumbling gait     Patient Active Problem List   Diagnosis Date Noted  . Medulloblastoma ([place]) 03/24/2011  . Anemia complicating pregnancy [id number]  . Cesarean delivery, without mention of indication, delivered, with or without mention of antepartum condition 11/23/2010  . Human immunodeficiency virus (HIV) disease 11/04/2010  . Threatened premature labor 09/11/2010    Past Surgical History:  Procedure Laterality Date  . CESAREAN SECTION  11/23/2010   Procedure: CESAREAN SECTION;  Surgeon: Agnes Lawrence, MD;  Location: [facility];  Service: Gynecology;  Laterality: N/A;  . TONSILLECTOMY AND ADENOIDECTOMY  ~ 1998    OB History    Gravida Para Term Preterm AB Living   3 3 2 1   3    SAB TAB Ectopic Multiple Live Births           1       Home Medications    Prior to Admission medications   Medication Sig Start Date End Date Taking? Authorizing Provider  acetaminophen (TYLENOL) 500 MG tablet Take 500-1,000 mg by mouth every 6 (six) hours as needed for headache.   Yes Historical Provider, MD  aspirin 325 MG tablet Take 325-650 mg by mouth every 4 (four) hours as needed for headache.    Yes Historical Provider, MD  ferrous sulfate 325 (65 FE) MG tablet TAKE 1 TABLET (325 MG TOTAL) BY MOUTH TWO   TIMES DAILY WITH A MEAL. 11/06/13  Yes Campbell Riches, MD  naproxen sodium (ALEVE) 220 MG tablet Take 220-440 mg by mouth 2 (two) times  daily as needed (for headaches).   Yes Historical Provider, MD  Prenatal Vit-Fe Fumarate-FA (PRENATAL MULTIVITAMIN) TABS tablet Take 1 tablet by mouth daily.    Yes Historical Provider, MD  STRIBILD 150-150-200-300 MG TABS tablet TAKE 1 TABLET BY MOUTH DAILY WITH BREAKFAST 03/12/14  Yes Campbell Riches, MD  bupivacaine-EPINEPHrine (MARCAINE W/ EPI) 0.25% -1:200000 SOLN Inject 15 mLs into the skin once. 03/04/13   Historical Provider, MD  lidocaine (XYLOCAINE) 2 % injection Inject 15 mLs into the skin once. 03/04/13   Historical Provider, MD  traMADol (ULTRAM) 50 MG tablet Take 1 tablet (50 mg total) by mouth every 6 (six) hours as needed. 05/27/13   Lajean Saver, MD    Family History Family History  Problem Relation Age of Onset  . Diabetes Mother   . Cancer Mother   . Thyroid disease Mother   . Hypertension Father   . Cancer Father   . Thyroid disease Father   . Stroke Father     Social History Social History  Substance Use Topics  . Smoking status: Never Smoker  . Smokeless tobacco: Never Used  . Alcohol use No     Allergies   Penicillins   Review of Systems Review of Systems   Physical Exam  Updated Vital Signs BP (!) 96/37 (BP Location: Right Arm)   Pulse 101   Temp 100.9 F (38.3 C) (Rectal)   Resp 18   Wt 140 lb (63.5 kg)   LMP 11/24/2015   SpO2 100%   BMI 25.61 kg/m   Physical Exam   ED Treatments / Results  Labs (all labs ordered are listed, but only abnormal results are displayed) Labs Reviewed  CBC WITH DIFFERENTIAL/PLATELET - Abnormal; Notable for the following:       Result Value   Lymphs Abs 0.5 (*)    All other components within normal limits  BASIC METABOLIC PANEL - Abnormal; Notable for the following:    Sodium 132 (*)    Chloride 99 (*)    All other components within normal limits  CULTURE, BLOOD (ROUTINE X 2)  CULTURE, BLOOD (ROUTINE X 2)  CSF CULTURE  GRAM STAIN  URINALYSIS, ROUTINE W REFLEX MICROSCOPIC (NOT AT [facility])  CSF CELL  COUNT WITH DIFFERENTIAL  CSF CELL COUNT WITH DIFFERENTIAL  GLUCOSE, CSF  PROTEIN, CSF  HERPES SIMPLEX VIRUS(HSV) DNA BY PCR  CRYPTOCOCCAL ANTIGEN, CSF  I-STAT CG4 LACTIC ACID, ED    EKG  EKG Interpretation None       Radiology Dg Chest 2 View  Result Date: 12/08/2015 CLINICAL DATA:  Chronic headaches.  History of brain tumor. EXAM: CHEST  2 VIEW COMPARISON:  03/22/2011 FINDINGS: The heart size and mediastinal contours are within normal limits. Both lungs are clear. The visualized skeletal structures are unremarkable. IMPRESSION: No active cardiopulmonary disease. Electronically Signed   By: Kathreen Devoid   On: 12/08/2015 17:43   Ct Head Wo Contrast  Result Date: 12/08/2015 CLINICAL DATA:  Frontal headache.  History of medulloblastoma EXAM: CT HEAD WITHOUT CONTRAST TECHNIQUE: Contiguous axial images were obtained from the base of the skull through the vertex without intravenous contrast. COMPARISON:  MR brain 05/27/2013 FINDINGS: Brain: No evidence of acute infarction, hemorrhage, hydrocephalus, extra-axial collection or mass lesion/mass effect. Mild encephalomalacia in the right cerebellum. Vascular: No hyperdense vessel or unexpected calcification. Skull: Prior suboccipital craniotomy. Sinuses/Orbits: Orbits are normal. Severe right maxillary sinus mucosal thickening. Other: None. IMPRESSION: 1. No acute intracranial pathology. 2. 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 If there is further clinical concern regarding recurrent or residual neoplasm, recommend MRI of the brain without and with intravenous contrast. Electronically Signed   By: Kathreen Devoid   On: 12/08/2015 16:12    Procedures .Lumbar Puncture Date/Time: 12/08/2015 7:00 PM Performed by: Sherwood Gambler Authorized by: Sherwood Gambler   Consent:    Consent obtained:  Verbal and written   Consent given by:  Patient   Risks discussed:  Bleeding, infection, pain, headache and nerve damage    Alternatives discussed:  No treatment Pre-procedure details:    Procedure purpose:  Diagnostic   Preparation: Patient was prepped and draped in usual sterile fashion   Anesthesia (see MAR for exact dosages):    Anesthesia method:  Local infiltration   Local anesthetic:  Lidocaine 1% w/o epi Procedure details:    Lumbar space:  L4-L5 interspace   Patient position:  Sitting   Needle gauge:  20   Ultrasound guidance: no     Number of attempts:  2   Opening pressure (cm H2O):  28   Fluid appearance:  Blood-tinged then clearing   Tubes of fluid:  4   Total volume (ml):  5 Post-procedure:    Puncture site:  Adhesive bandage applied and direct pressure applied  Patient tolerance of procedure:  Tolerated well, no immediate complications   (including critical care time)  Medications Ordered in ED Medications  HYDROmorphone (DILAUDID) injection 1 mg (1 mg Intravenous Given 12/08/15 1629)  ondansetron (ZOFRAN) injection 4 mg (4 mg Intravenous Given 12/08/15 1627)  sodium chloride 0.9 % bolus 1,000 mL (1,000 mLs Intravenous New Bag/Given 12/08/15 1650)  acetaminophen (TYLENOL) tablet 650 mg (650 mg Oral Given 12/08/15 1750)  HYDROmorphone (DILAUDID) injection 1 mg (1 mg Intravenous Given 12/08/15 1847)     Initial Impression / Assessment and Plan / ED Course  I have reviewed the triage vital signs and the nursing notes.  Pertinent labs & imaging results that were available during my care of the patient were reviewed by me and considered in my medical decision making (see chart for details).  Clinical Course    Patient presents with acute headache, worse than typical but similar in quality. Given low grade fever rectally, LP performed. No meningitis, cryptococcal antigen negative. D/w Dr. Linus Salmons, at this time no antibiotics but supportive care. No dysuria, has nitrites, but no leukocytes or bacteria. Culture but no abx. Multiple BPs in 90s but no hypotension. Fluids, observe in  hospital.  Final Clinical Impressions(s) / ED Diagnoses   Final diagnoses:  None    New Prescriptions New Prescriptions   No medications on file     Sherwood Gambler, MD 12/09/15 0030

## 2015-12-08 NOTE — ED Triage Notes (Signed)
Pt here for chronic headache, pt has hx of brain tumor. She has had 3 brain tumors in the past and recently found out it is back. She is shaking in triage and reports severe pain.

## 2015-12-08 NOTE — ED Notes (Signed)
Checked the rectal temp it was 100.9 notified RN [place] of temp

## 2015-12-08 NOTE — ED Notes (Signed)
Patient in room

## 2015-12-08 NOTE — ED Notes (Signed)
Doctor at bedside.

## 2015-12-09 ENCOUNTER — Observation Stay (HOSPITAL_COMMUNITY): Payer: Medicare Other

## 2015-12-09 DIAGNOSIS — E861 Hypovolemia: Secondary | ICD-10-CM | POA: Diagnosis present

## 2015-12-09 DIAGNOSIS — Z85841 Personal history of malignant neoplasm of brain: Secondary | ICD-10-CM | POA: Diagnosis not present

## 2015-12-09 DIAGNOSIS — Z823 Family history of stroke: Secondary | ICD-10-CM

## 2015-12-09 DIAGNOSIS — R51 Headache: Secondary | ICD-10-CM

## 2015-12-09 DIAGNOSIS — R208 Other disturbances of skin sensation: Secondary | ICD-10-CM

## 2015-12-09 DIAGNOSIS — Z7982 Long term (current) use of aspirin: Secondary | ICD-10-CM | POA: Diagnosis not present

## 2015-12-09 DIAGNOSIS — G629 Polyneuropathy, unspecified: Secondary | ICD-10-CM

## 2015-12-09 DIAGNOSIS — Z923 Personal history of irradiation: Secondary | ICD-10-CM

## 2015-12-09 DIAGNOSIS — Z8249 Family history of ischemic heart disease and other diseases of the circulatory system: Secondary | ICD-10-CM

## 2015-12-09 DIAGNOSIS — Z23 Encounter for immunization: Secondary | ICD-10-CM | POA: Diagnosis not present

## 2015-12-09 DIAGNOSIS — Z21 Asymptomatic human immunodeficiency virus [HIV] infection status: Secondary | ICD-10-CM

## 2015-12-09 DIAGNOSIS — G43009 Migraine without aura, not intractable, without status migrainosus: Principal | ICD-10-CM

## 2015-12-09 DIAGNOSIS — B20 Human immunodeficiency virus [HIV] disease: Secondary | ICD-10-CM

## 2015-12-09 DIAGNOSIS — Z9181 History of falling: Secondary | ICD-10-CM | POA: Diagnosis not present

## 2015-12-09 DIAGNOSIS — C716 Malignant neoplasm of cerebellum: Secondary | ICD-10-CM

## 2015-12-09 DIAGNOSIS — Z809 Family history of malignant neoplasm, unspecified: Secondary | ICD-10-CM | POA: Diagnosis not present

## 2015-12-09 DIAGNOSIS — Z833 Family history of diabetes mellitus: Secondary | ICD-10-CM | POA: Diagnosis not present

## 2015-12-09 DIAGNOSIS — H53149 Visual discomfort, unspecified: Secondary | ICD-10-CM

## 2015-12-09 DIAGNOSIS — R000 Tachycardia, unspecified: Secondary | ICD-10-CM | POA: Diagnosis present

## 2015-12-09 DIAGNOSIS — Z8349 Family history of other endocrine, nutritional and metabolic diseases: Secondary | ICD-10-CM

## 2015-12-09 DIAGNOSIS — I959 Hypotension, unspecified: Secondary | ICD-10-CM | POA: Diagnosis present

## 2015-12-09 DIAGNOSIS — G43909 Migraine, unspecified, not intractable, without status migrainosus: Secondary | ICD-10-CM | POA: Diagnosis not present

## 2015-12-09 DIAGNOSIS — Z9889 Other specified postprocedural states: Secondary | ICD-10-CM

## 2015-12-09 DIAGNOSIS — Z880 Allergy status to penicillin: Secondary | ICD-10-CM

## 2015-12-09 LAB — BASIC METABOLIC PANEL
Anion gap: 6 (ref 5–15)
BUN: 8 mg/dL (ref 6–20)
CHLORIDE: 103 mmol/L (ref 101–111)
CO2: 29 mmol/L (ref 22–32)
Calcium: 8.8 mg/dL — ABNORMAL LOW (ref 8.9–10.3)
Creatinine, Ser: 0.89 mg/dL (ref 0.44–1.00)
GFR calc Af Amer: >60 mL/min (ref >60)
GFR calc non Af Amer: >60 mL/min (ref >60)
GLUCOSE: 86 mg/dL (ref 65–99)
POTASSIUM: 4.1 mmol/L (ref 3.5–5.1)
Sodium: 138 mmol/L (ref 135–145)

## 2015-12-09 LAB — HERPES SIMPLEX VIRUS(HSV) DNA BY PCR
HSV 1 DNA: NEGATIVE
HSV 2 DNA: NEGATIVE

## 2015-12-09 LAB — CBC
HEMATOCRIT: 35.9 % — AB (ref 36.0–46.0)
Hemoglobin: 11.5 g/dL — ABNORMAL LOW (ref 12.0–15.0)
MCH: 27.1 pg (ref 26.0–34.0)
MCHC: 32 g/dL (ref 30.0–36.0)
MCV: 84.5 fL (ref 78.0–100.0)
Platelets: 165 10*3/uL (ref 150–400)
RBC: 4.25 MIL/uL (ref 3.87–5.11)
RDW: 13.6 % (ref 11.5–15.5)
WBC: 3.1 10*3/uL — ABNORMAL LOW (ref 4.0–10.5)

## 2015-12-09 LAB — PREGNANCY, URINE: Preg Test, Ur: NEGATIVE

## 2015-12-09 MED ORDER — KETOROLAC TROMETHAMINE 30 MG/ML IJ SOLN
30.0000 mg | Freq: Once | INTRAMUSCULAR | Status: AC
  Administered 2015-12-09: 30 mg via INTRAVENOUS
  Filled 2015-12-09: qty 1

## 2015-12-09 MED ORDER — INFLUENZA VAC SPLIT QUAD 0.5 ML IM SUSY
0.5000 mL | PREFILLED_SYRINGE | INTRAMUSCULAR | Status: AC
  Administered 2015-12-10: 0.5 mL via INTRAMUSCULAR
  Filled 2015-12-09: qty 0.5

## 2015-12-09 MED ORDER — SUMATRIPTAN SUCCINATE 50 MG PO TABS
50.0000 mg | ORAL_TABLET | ORAL | Status: DC | PRN
  Administered 2015-12-09 – 2015-12-11 (×3): 50 mg via ORAL
  Filled 2015-12-09 (×4): qty 1

## 2015-12-09 MED ORDER — DIPHENHYDRAMINE HCL 50 MG/ML IJ SOLN
25.0000 mg | Freq: Once | INTRAMUSCULAR | Status: AC
  Administered 2015-12-09: 25 mg via INTRAVENOUS
  Filled 2015-12-09: qty 1

## 2015-12-09 MED ORDER — ELVITEG-COBIC-EMTRICIT-TENOFAF 150-150-200-10 MG PO TABS
1.0000 | ORAL_TABLET | Freq: Every day | ORAL | Status: DC
  Administered 2015-12-09 – 2015-12-11 (×3): 1 via ORAL
  Filled 2015-12-09 (×3): qty 1

## 2015-12-09 MED ORDER — PROCHLORPERAZINE EDISYLATE 5 MG/ML IJ SOLN
10.0000 mg | Freq: Once | INTRAMUSCULAR | Status: AC
  Administered 2015-12-09: 10 mg via INTRAVENOUS
  Filled 2015-12-09: qty 2

## 2015-12-09 MED ORDER — GADOBENATE DIMEGLUMINE 529 MG/ML IV SOLN
15.0000 mL | Freq: Once | INTRAVENOUS | Status: AC
  Administered 2015-12-09: 15 mL via INTRAVENOUS

## 2015-12-09 MED ORDER — ACETAMINOPHEN 500 MG PO TABS
500.0000 mg | ORAL_TABLET | Freq: Four times a day (QID) | ORAL | Status: DC | PRN
  Administered 2015-12-09 – 2015-12-11 (×3): 1000 mg via ORAL
  Filled 2015-12-09 (×4): qty 2

## 2015-12-09 MED ORDER — SODIUM CHLORIDE 0.9 % IV BOLUS (SEPSIS)
1000.0000 mL | Freq: Once | INTRAVENOUS | Status: AC
  Administered 2015-12-09: 1000 mL via INTRAVENOUS

## 2015-12-09 NOTE — Progress Notes (Signed)
New Admission Note: Pt transferred from the [facility] to room 6E30  Arrival Method: via stretcher Mental Orientation: Alert and Oriented x 4 Telemetry: N/A Assessment: Completed Skin: Intact IV: L AC SL Pain: Pt with c/o headache 10/10 - awaiting orders Tubes: None Safety Measures: Safety Fall Prevention Plan has been discussed  Admission: To be completed 6 [place] Orientation: Patient has been orientated to the room, unit and staff.  Family: Pt's mother and daughter at the bedside  Orders to be reviewed and implemented. Will continue to monitor the patient. Call light has been placed within reach and bed alarm has been activated.   Mady Gemma, BSN, RN-BC Phone: [phone]

## 2015-12-09 NOTE — Consult Note (Signed)
Neurology Consult Note  Reason for Consultation: headache, abnormal MRI  Requesting provider: Dorcas Mcmurray, MD  CC: Headache  HPI: This is a 30 year old right-handed woman with history of medulloblastoma status post resection and HIV who now presents to the hospital with severe headache. History is obtained directly from the patient who is an excellent historian. I have also reviewed the patient's medical record.  The patient reports that she developed a severe headache yesterday. She reports that this initially started in the left occipital region and then spread over to the right occipital region before descending over the top of her head. The headache was initially very intense for the first few minutes before settling to a more moderate headache. It was associated with nausea and photophobia. Photophobia developed as the headache persisted. She has a history of similar headaches in the past, but says none have ever been this severe. Otherwise, the characteristics of her prior headaches are very much the same. These periodic headaches are superimposed upon a background of low-grade daily headache pain which she has had for many years in association with tumor and craniotomy. She states that she has been diagnosed with migraine headaches in the past but cannot recall if she was ever placed on any specific medications to treat them. Generally, she takes Aleve for severe headaches with fair results.  She has a history of medulloblastoma treated at [facility] with suboccipital craniotomy on 03/22/11. This was followed by craniospinal irradiation with subsequent gamma knife radiosurgery 2 on 02/27/13 and again on 11/03/14. She is due to follow up with [facility] in the next few weeks and says she has another gamma knife procedure scheduled for November 9.  She reports that she has had about 6 falls over the past several months. These are generally associated with increased tremor on the right side. She has  had some residual right-sided tremor and coordination deficits since her tumor and its treatment. She does not feel as if these deficits or any worse than usual. However, she has noticed some tingling in the fingertips of her right hand. These involve all fingers with the exception of the thumb. This is been present for several months as well. No recent changes reported. This tingling does not limit her ability to use her hand and she is not endorsing any actual weakness on that side.  PMH:  Past Medical History:  Diagnosis Date  . Brain tumor ([place])    4th ventricle; dx'd 03/22/11  . Chronic UTI    "since I was 30 year old"  . HIV (human immunodeficiency virus infection) ([place])     diagnosed 08/2010  . Medulloblastoma ([place]) 03/24/11  . Migraine   . Preterm delivery   . Pyelonephritis    1st pregnancy  . Stumbling gait     PSH:  Past Surgical History:  Procedure Laterality Date  . CESAREAN SECTION  11/23/2010   Procedure: CESAREAN SECTION;  Surgeon: Agnes Lawrence, MD;  Location: [facility];  Service: Gynecology;  Laterality: N/A;  . TONSILLECTOMY AND ADENOIDECTOMY  ~ 1998    Family history: Family History  Problem Relation Age of Onset  . Diabetes Mother   . Cancer Mother   . Thyroid disease Mother   . Hypertension Father   . Cancer Father   . Thyroid disease Father   . Stroke Father     Social history:  Social History   Social History  . Marital status: Single    Spouse name: N/A  .  Number of children: N/A  . Years of education: N/A   Occupational History  . Not on file.   Social History Main Topics  . Smoking status: Never Smoker  . Smokeless tobacco: Never Used  . Alcohol use No  . Drug use: No  . Sexual activity: Yes    Partners: Male    Birth control/ protection: Condom     Comment: pt. declined condoms   Other Topics Concern  . Not on file   Social History Narrative  . No narrative on file    Current outpatient meds: Current Meds  Medication Sig   . acetaminophen (TYLENOL) 500 MG tablet Take 500-1,000 mg by mouth every 6 (six) hours as needed for headache.  Marland Kitchen aspirin 325 MG tablet Take 325-650 mg by mouth every 4 (four) hours as needed for headache.   . ferrous sulfate 325 (65 FE) MG tablet TAKE 1 TABLET (325 MG TOTAL) BY MOUTH TWO   TIMES DAILY WITH A MEAL.  . naproxen sodium (ALEVE) 220 MG tablet Take 220-440 mg by mouth 2 (two) times daily as needed (for headaches).  . Prenatal Vit-Fe Fumarate-FA (PRENATAL MULTIVITAMIN) TABS tablet Take 1 tablet by mouth daily.   . STRIBILD 150-150-200-300 MG TABS tablet TAKE 1 TABLET BY MOUTH DAILY WITH BREAKFAST    Current inpatient meds:  Current Facility-Administered Medications  Medication Dose Route Frequency Provider Last Rate Last Dose  . acetaminophen (TYLENOL) tablet 500-1,000 mg  500-1,000 mg Oral Q6H PRN Archie Patten, MD      . elvitegravir-cobicistat-emtricitabine-tenofovir (GENVOYA) 150-150-200-10 MG tablet 1 tablet  1 tablet Oral Q breakfast Archie Patten, MD   1 tablet at 12/09/15 0845  . [START ON 12/10/2015] Influenza vac split quadrivalent PF (FLUARIX) injection 0.5 mL  0.5 mL Intramuscular Tomorrow-1000 Dickie La, MD        Allergies: Allergies  Allergen Reactions  . Penicillins Hives and Itching    Pt has tolerated rocephin in past. Has patient had a PCN reaction causing immediate rash, facial/tongue/throat swelling, SOB or lightheadedness with hypotension: Yes Has patient had a PCN reaction causing severe rash involving mucus membranes or skin necrosis: No Has patient had a PCN reaction that required hospitalization No Has patient had a PCN reaction occurring within the last 10 years: No If all of the above answers are "NO", then may proceed with Cephalosporin use.     ROS: As per HPI. A full 14-point review of systems was performed and is otherwise unremarkable.   PE:  BP 108/79 (BP Location: Left Arm)   Pulse 92   Temp 98.2 F (36.8 C) (Oral)   Resp 18    Wt 61.2 kg (134 lb 14.7 oz)   LMP 11/24/2015   SpO2 100%   BMI 24.68 kg/m   General: WDWN, no acute distress. AAO x4. Speech clear, no dysarthria. No aphasia. Follows commands briskly. Affect is bright with congruent mood. Comportment is normal.  HEENT: Normocephalic. Neck supple without LAD. MMM, OP clear. Dentition good. Sclerae anicteric. No conjunctival injection.  CV: Regular, no murmur. Carotid pulses full and symmetric, no bruits. Distal pulses 2+ and symmetric.  Lungs: CTAB.  Abdomen: Soft, non-distended, non-tender. Bowel sounds present x4.  Extremities: No C/C/E. Neuro:  CN: Pupils are equal and round. They are symmetrically reactive from 3-->2 mm. EOMI without nystagmus. No reported diplopia. Facial sensation is intact to light touch. Face is symmetric at rest with normal strength and mobility. Hearing is intact to conversational voice. Palate  elevates symmetrically and uvula is midline. Voice is normal in tone, pitch and quality. Bilateral SCM and trapezii are 5/5. Tongue is midline with normal bulk and mobility.  Motor: Normal bulk, tone, and strength. With increased exertion and muscle contraction on the right, she develops a course tremor in those extremities that resolves when exertion is stopped. No drift.  Sensation: Intact to light touch and vibration. She has a patchy reduction in pinprick over both forearms and hands in a glove distribution, worse on the palmar surface. On the left she also reports dysesthesia with pinprick testing in the forearm and hand. DTRs: 2+, symmetric. Toes downgoing bilaterally. No pathologic reflexes.  Coordination: Finger-to-nose and heel-to-shin are dysmetric on the right. Finger taps are slightly clumsy on the right.    Labs:  Lab Results  Component Value Date   WBC 3.1 (L) 12/09/2015   HGB 11.5 (L) 12/09/2015   HCT 35.9 (L) 12/09/2015   PLT 165 12/09/2015   GLUCOSE 86 12/09/2015   CHOL 154 01/18/2012   TRIG 83 01/18/2012   HDL 50  01/18/2012   LDLCALC 87 01/18/2012   ALT 7 05/27/2013   AST 17 05/27/2013   NA 138 12/09/2015   K 4.1 12/09/2015   CL 103 12/09/2015   CREATININE 0.89 12/09/2015   BUN 8 12/09/2015   CO2 29 12/09/2015   Lactate 0.92 Urine pregnancy negative CSF WBCs 6-->8, no differential provided CSF RBCs 610-->229 CSF xanthochromia absent CSF protein 32 CSF glucose 51 CSF culture pending CD4 count pending  Imaging:  I have personally and independently reviewed the MRI scan of the head with and without contrast from today. There is evidence of a previous suboccipital craniotomy. Focal encephalomalacia is seen in the right inferior cerebellum. Along the right side of the fourth ventricle, there is nodular enhancement following the administration of contrast. Moderate diffuse cerebellar atrophy is present. A milder degree of diffuse cortical atrophy is observed. 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  Assessment and Plan:  1. Migraine headache without aura: Her headache as described is consistent with migraine given severe throbbing headache with associated nausea, photophobia, and phonophobia. She has a history of similar headaches though none quite as severe as the one that led to her admission. Lumbar puncture shows no evidence of CNS infection. MRI scan does not show any explanation for severe acute headache. At this point, I would recommend usual migraine management. I discussed trial of sumatriptan and while she is here in the hospital and she would like to try this today. If this is effective, then I would consider continuing this upon discharge. She can continue to use over-the-counter medications if these are effective, though it should be kept in mind that abortive migraine therapy with over-the-counter medications generally requires higher doses then typically recommended on the package labeling. For example, the recommended dose is 800 mg of ibuprofen or 440 mg of naproxen. Excedrin  could also be considered as this is highly effective for many people with migraine headache. Regardless of the medication, the earlier she takes it after the onset of her headache the more effective it will be. If she continues to have increased frequency or increased severity of her headaches, then consideration could be given to a trial of a prophylactic agent but this should be deferred to the outpatient setting.  2. Medulloblastoma: She is being followed at [facility] and reports that she has upcoming appointment for gamma knife surgery on November 9. I reviewed  the report for an MRI of the brain with and without contrast on 11/11/15 that was performed at [facility]. This states "there is similar nodular enhancement within the fourth ventricle relative to 04/28/15." Unfortunately this is not further characterized and I do not have the images for direct review or comparison so I cannot objectively say how the MRI scan today compared to that from one month ago at the outside hospital. I would recommend that she get a copy of her scan at discharge so that she may take this with her to her upcoming appointment with her treating provider at [facility]. No specific recommendations regarding her medulloblastoma at this time.  3. Dysesthesias: She has some nonspecific tingling. Subjectively she notices this in the fingertips of her right hand, sparing the thumb. On examination, she indicates decreased pinprick with dysesthesias in both forearms and hands, worse distally. This is suggestive of a peripheral polyneuropathy. This can be seen with HIV infection itself. These could be in part related to her antiretroviral medication as dysesthesias and peripheral neuropathy also been reported with emtricitabine, elvitegravir, and tenofovir. If not done recently, would recommend checking vitamin B12, thyroid function, and RPR.  4. Falls: This sounds like an occasional problem that has been chronic in nature. She states  that she typically has falls associated with increased tremor on the right side. These are infrequent. It sounds as if she is mindful of situations that may predispose to fall and is encouraged to use additional caution in such situations.  This was all discussed with the patient and her mother. They are in agreement with the plan as noted. They were given the opportunity to ask any questions and these were addressed to their satisfaction.  Thank you for the opportunity to participate in this patient's care. Please feel free to call with any questions or concerns. At this time, I have no additional recommendations and will sign off.

## 2015-12-09 NOTE — Care Management Note (Signed)
Case Management Note  Patient Details  Name: Jodi Morrow MRN: [id number] Date of Birth: [birth year]  Subjective/Objective:        CM following for progression and d/c planning.             Action/Plan: 12/09/2015 Noted CM referral for HH needs, CM following for eval and orders. Noted that this pt has [place] Medicaidl, therefore no payor source for HHPT. [employer] will cover for CVA, joint replacement or trauma only.  Will continue to follow.   Expected Discharge Date:  12/11/15               Expected Discharge Plan:  [place]  In-House Referral:  Clinical Social Work  Discharge planning Services  CM Consult  Post Acute Care Choice:    Choice offered to:     DME Arranged:    DME Agency:     HH Arranged:    [place] Agency:     Status of Service:  In process, will continue to follow  If discussed at Long Length of Stay Meetings, dates discussed:    Additional Comments:  Adron Bene, RN 12/09/2015, 11:12 AM

## 2015-12-09 NOTE — Progress Notes (Addendum)
Pt am B/P = 77/47 with c/o dizziness. Page to [facility] for notification. Return call from Dr. Andy Gauss with new order for 1 liter NS IV bolus. Dorthey Sawyer, RN

## 2015-12-09 NOTE — Consult Note (Signed)
[facility] for Infectious Disease  Total days of antibiotics 0               Reason for Consult: Headache, HIV    Referring Physician:  Family Medicine Res[facility]  Active Problems:   Headache  HPI: Jodi Morrow is a 30 y.o. female with PMHx of recurrent Medulloblastoma (s/p craniotomy, irradiation and gamma knife radiosurgery), HIV (HIV RNA <03 November 2014 on Stribild) and chronic headaches who presented to the ED on 10/25 with complaint of severe headache.   Patient states that yesterday she developed a severe 12/10 migraine associated with nausea without vomiting, photophobia and phonophobia. She denies fever, chills, neck stiffness, shortness of breath, cough, nasal congestion, sore throat, abdominal pain, diarrhea or rash. She has history of chronic headache since last November and she feels she can function at a 6/10; however, this was more severe than normal. A migraine cocktail overnight improved her symptoms to an 8/10 but it continues to feel worse above her baseline. Patient also has HIV and follows with [facility] (?) and is unsure of her last CD4 count. She had to take Bactrim in the past, but was told she could stop it. She last saw them 2 months ago for follow up. She is on Stribild daily and reports good compliance.   On admission, patient was afebrile at 99.9, B 98/69, HR 145, RR 24, satting 95 % on room air. She became febrile to 100.9 once overnight. WBC trended 5 to 3.1 this morning with a normal differential, but low absolute lymphs. BMET with mild hyponatremia, otherwise normal. Lactic acid normal. Pregnancy test negative. Cryptococcal antigen negative. UA without infection. CXR without acute cardiopulmonary disease. MRI showed resection radiation of medulloblastoma with encephalomalacia in the cerebellum right-greater-than-left. Progression of nodular enhancement in the fourth ventricle on the right, concerning for recurrent tumor. Lumbar puncture obtained which  revealed CSF cryptococcal antigen negative, CSF glucose 51, total protein 32, RBC 229 in tube 4, 600 in tube 1, WBC 8, clear. CSF culture pending. Blood cultures pending.   1. Suboccipital craniotomy 03/22/11 by Dr. Arlan Organ  2. Craniospinal irradiation (36 Gy) followed by boost to posterior fossa (54 Gy) completed in May 2013  3. Gamma Knife Radiosurgery to anterior vermis and posterior vermis on 02/27/13 (22 Gy to the 50% isodose line) 4. Gamma Knife Radiosurgery on 11/03/14 (22 Gy to the 50% isodose line) 5. MRI Brain 9/28 showed similar nodular enhancement within the posterior fossa resection cavity compared to 04/28/2015, which may reflect post surgical/posttreatment change versus residual/recurrent tumor. Recommend continued attention on close follow-up. No new enhancing lesions.   Past Medical History:  Diagnosis Date  . Brain tumor ([place])    4th ventricle; dx'd 03/22/11  . Chronic UTI    "since I was 30 year old"  . HIV (human immunodeficiency virus infection) ([place])     diagnosed 08/2010  . Medulloblastoma ([place]) 03/24/11  . Migraine   . Preterm delivery   . Pyelonephritis    1st pregnancy  . Stumbling gait     Allergies:  Allergies  Allergen Reactions  . Penicillins Hives and Itching    Pt has tolerated rocephin in past. Has patient had a PCN reaction causing immediate rash, facial/tongue/throat swelling, SOB or lightheadedness with hypotension: Yes Has patient had a PCN reaction causing severe rash involving mucus membranes or skin necrosis: No Has patient had a PCN reaction that required hospitalization No Has patient had a PCN reaction occurring within the last  10 years: No If all of the above answers are "NO", then may proceed with Cephalosporin use.     Current antibiotics: None  MEDICATIONS: . elvitegravir-cobicistat-emtricitabine-tenofovir  1 tablet Oral Q breakfast  . [START ON 12/10/2015] Influenza vac split quadrivalent PF  0.5 mL Intramuscular Tomorrow-1000     Social History  Substance Use Topics  . Smoking status: Never Smoker  . Smokeless tobacco: Never Used  . Alcohol use No    Family History  Problem Relation Age of Onset  . Diabetes Mother   . Cancer Mother   . Thyroid disease Mother   . Hypertension Father   . Cancer Father   . Thyroid disease Father   . Stroke Father     Review of Systems: A complete ROS was negative except as per HPI.   OBJECTIVE: Vitals:   12/09/15 0024 12/09/15 0504 12/09/15 0657 12/09/15 0908  BP: 105/69 (!) 77/47 110/70 108/79  Pulse: 92 73  92  Resp: '19 20  18  ' Temp: 98.4 F (36.9 C) 98.5 F (36.9 C)  98.2 F (36.8 C)  TempSrc: Oral Oral  Oral  SpO2: 100% 100%  100%  Weight: 134 lb 14.7 oz (61.2 kg)      General: Vital signs reviewed.  Patient is well-developed and well-nourished, in no acute distress and cooperative with exam.  Eyes: PERRL, conjunctivae normal, no scleral icterus.  Neck: Supple, trachea midline Cardiovascular: RRR, S1 normal, S2 normal, no murmurs, gallops, or rubs. Pulmonary/Chest: Clear to auscultation bilaterally, no wheezes, rales, or rhonchi. Abdominal: Soft, non-tender, non-distended, BS +, no guarding present.  Extremities: No lower extremity edema bilaterally,  pulses symmetric and intact bilaterally.  Neurological: A&O x3, no focal neurological deficits. Negative Kernig's and Brudzinski's.  Skin: Warm, dry and intact. No rashes or erythema. Psychiatric: Normal mood and affect. speech and behavior is normal. Cognition and memory are normal.    LABS: Results for orders placed or performed during the hospital encounter of 12/08/15 (from the past 48 hour(s))  CBC with Differential/Platelet     Status: Abnormal   Collection Time: 12/08/15  4:30 PM  Result Value Ref Range   WBC 5.7 4.0 - 10.5 K/uL   RBC 4.41 3.87 - 5.11 MIL/uL   Hemoglobin 12.2 12.0 - 15.0 g/dL   HCT 37.0 36.0 - 46.0 %   MCV 83.9 78.0 - 100.0 fL   MCH 27.7 26.0 - 34.0 pg   MCHC 33.0 30.0 -  36.0 g/dL   RDW 13.2 11.5 - 15.5 %   Platelets 195 150 - 400 K/uL   Neutrophils Relative % 88 %   Neutro Abs 5.0 1.7 - 7.7 K/uL   Lymphocytes Relative 8 %   Lymphs Abs 0.5 (L) 0.7 - 4.0 K/uL   Monocytes Relative 4 %   Monocytes Absolute 0.2 0.1 - 1.0 K/uL   Eosinophils Relative 0 %   Eosinophils Absolute 0.0 0.0 - 0.7 K/uL   Basophils Relative 0 %   Basophils Absolute 0.0 0.0 - 0.1 K/uL  Basic metabolic panel     Status: Abnormal   Collection Time: 12/08/15  4:30 PM  Result Value Ref Range   Sodium 132 (L) 135 - 145 mmol/L   Potassium 3.9 3.5 - 5.1 mmol/L   Chloride 99 (L) 101 - 111 mmol/L   CO2 26 22 - 32 mmol/L   Glucose, Bld 87 65 - 99 mg/dL   BUN 8 6 - 20 mg/dL   Creatinine, Ser 0.79 0.44 - 1.00 mg/dL  Calcium 9.2 8.9 - 10.3 mg/dL   GFR calc non Af Amer >60 >60 mL/min   GFR calc Af Amer >60 >60 mL/min    Comment: (NOTE) The eGFR has been calculated using the CKD EPI equation. This calculation has not been validated in all clinical situations. eGFR's persistently <60 mL/min signify possible Chronic Kidney Disease.    Anion gap 7 5 - 15  I-Stat CG4 Lactic Acid, ED     Status: None   Collection Time: 12/08/15  4:52 PM  Result Value Ref Range   Lactic Acid, Venous 0.92 0.5 - 1.9 mmol/L  Urinalysis, Routine w reflex microscopic     Status: Abnormal   Collection Time: 12/08/15  6:20 PM  Result Value Ref Range   Color, Urine YELLOW YELLOW   APPearance CLOUDY (A) CLEAR   Specific Gravity, Urine 1.013 1.005 - 1.030   pH 8.5 (H) 5.0 - 8.0   Glucose, UA NEGATIVE NEGATIVE mg/dL   Hgb urine dipstick NEGATIVE NEGATIVE   Bilirubin Urine NEGATIVE NEGATIVE   Ketones, ur NEGATIVE NEGATIVE mg/dL   Protein, ur NEGATIVE NEGATIVE mg/dL   Nitrite POSITIVE (A) NEGATIVE   Leukocytes, UA NEGATIVE NEGATIVE  Urine microscopic-add on     Status: Abnormal   Collection Time: 12/08/15  6:20 PM  Result Value Ref Range   Squamous Epithelial / LPF 0-5 (A) NONE SEEN   WBC, UA 0-5 0 - 5  WBC/hpf   RBC / HPF NONE SEEN 0 - 5 RBC/hpf   Bacteria, UA FEW (A) NONE SEEN  CSF cell count with differential collection tube #: 1     Status: Abnormal   Collection Time: 12/08/15  7:14 PM  Result Value Ref Range   Tube # 1    Color, CSF PINK (A) COLORLESS   Appearance, CSF CLEAR CLEAR   Supernatant COLORLESS    RBC Count, CSF 610 (H) 0 /cu mm   WBC, CSF 6 (H) 0 - 5 /cu mm   Other Cells, CSF TOO FEW TO COUNT, SMEAR AVAILABLE FOR REVIEW     Comment: Few lymphs, rare monos.  CSF cell count with differential collection tube #: 4     Status: Abnormal   Collection Time: 12/08/15  7:15 PM  Result Value Ref Range   Tube # 4    Color, CSF COLORLESS COLORLESS   Appearance, CSF CLEAR CLEAR   Supernatant NOT INDICATED    RBC Count, CSF 229 (H) 0 /cu mm   WBC, CSF 8 (H) 0 - 5 /cu mm   Other Cells, CSF TOO FEW TO COUNT, SMEAR AVAILABLE FOR REVIEW     Comment: Few lymphs, rare monos.  CSF culture     Status: None (Preliminary result)   Collection Time: 12/08/15  7:15 PM  Result Value Ref Range   Specimen Description CSF    Special Requests Immunocompromised    Gram Stain      WBC PRESENT, PREDOMINANTLY MONONUCLEAR NO ORGANISMS SEEN CYTOSPIN SMEAR    Culture NO GROWTH < 24 HOURS    Report Status PENDING   Glucose, CSF     Status: None   Collection Time: 12/08/15  7:16 PM  Result Value Ref Range   Glucose, CSF 51 40 - 70 mg/dL  Protein, CSF     Status: None   Collection Time: 12/08/15  7:16 PM  Result Value Ref Range   Total  Protein, CSF 32 15 - 45 mg/dL  Cryptococcal antigen, CSF     Status:  None   Collection Time: 12/08/15  7:16 PM  Result Value Ref Range   Crypto Ag NEGATIVE NEGATIVE   Cryptococcal Ag Titer NOT INDICATED NOT INDICATED  Culture, blood (routine x 2)     Status: None (Preliminary result)   Collection Time: 12/08/15  8:20 PM  Result Value Ref Range   Specimen Description BLOOD RIGHT HAND    Special Requests BOTTLES DRAWN AEROBIC AND ANAEROBIC 5CC    Culture NO  GROWTH < 24 HOURS    Report Status PENDING   Culture, blood (routine x 2)     Status: None (Preliminary result)   Collection Time: 12/08/15  8:25 PM  Result Value Ref Range   Specimen Description BLOOD LEFT HAND    Special Requests BOTTLES DRAWN AEROBIC AND ANAEROBIC 5CC    Culture NO GROWTH < 24 HOURS    Report Status PENDING   Basic metabolic panel     Status: Abnormal   Collection Time: 12/09/15  5:35 AM  Result Value Ref Range   Sodium 138 135 - 145 mmol/L   Potassium 4.1 3.5 - 5.1 mmol/L   Chloride 103 101 - 111 mmol/L   CO2 29 22 - 32 mmol/L   Glucose, Bld 86 65 - 99 mg/dL   BUN 8 6 - 20 mg/dL   Creatinine, Ser 0.89 0.44 - 1.00 mg/dL   Calcium 8.8 (L) 8.9 - 10.3 mg/dL   GFR calc non Af Amer >60 >60 mL/min   GFR calc Af Amer >60 >60 mL/min    Comment: (NOTE) The eGFR has been calculated using the CKD EPI equation. This calculation has not been validated in all clinical situations. eGFR's persistently <60 mL/min signify possible Chronic Kidney Disease.    Anion gap 6 5 - 15  CBC     Status: Abnormal   Collection Time: 12/09/15  5:35 AM  Result Value Ref Range   WBC 3.1 (L) 4.0 - 10.5 K/uL   RBC 4.25 3.87 - 5.11 MIL/uL   Hemoglobin 11.5 (L) 12.0 - 15.0 g/dL   HCT 35.9 (L) 36.0 - 46.0 %   MCV 84.5 78.0 - 100.0 fL   MCH 27.1 26.0 - 34.0 pg   MCHC 32.0 30.0 - 36.0 g/dL   RDW 13.6 11.5 - 15.5 %   Platelets 165 150 - 400 K/uL  Pregnancy, urine     Status: None   Collection Time: 12/09/15 11:56 AM  Result Value Ref Range   Preg Test, Ur NEGATIVE NEGATIVE    Comment:        THE SENSITIVITY OF THIS METHODOLOGY IS >20 mIU/mL.     MICRO: BCx 10/25 >> CSF Cx 10/25 >>  IMAGING: Dg Chest 2 View  Result Date: 12/08/2015 CLINICAL DATA:  Chronic headaches.  History of brain tumor. EXAM: CHEST  2 VIEW COMPARISON:  03/22/2011 FINDINGS: The heart size and mediastinal contours are within normal limits. Both lungs are clear. The visualized skeletal structures are  unremarkable. IMPRESSION: No active cardiopulmonary disease. Electronically Signed   By: Kathreen Devoid   On: 12/08/2015 17:43   Ct Head Wo Contrast  Result Date: 12/08/2015 CLINICAL DATA:  Frontal headache.  History of medulloblastoma EXAM: CT HEAD WITHOUT CONTRAST TECHNIQUE: Contiguous axial images were obtained from the base of the skull through the vertex without intravenous contrast. COMPARISON:  MR brain 05/27/2013 FINDINGS: Brain: No evidence of acute infarction, hemorrhage, hydrocephalus, extra-axial collection or mass lesion/mass effect. Mild encephalomalacia in the right cerebellum. Vascular: No hyperdense vessel or  unexpected calcification. Skull: Prior suboccipital craniotomy. Sinuses/Orbits: Orbits are normal. Severe right maxillary sinus mucosal thickening. Other: None. IMPRESSION: 1. No acute intracranial pathology. 2. 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 If there is further clinical concern regarding recurrent or residual neoplasm, recommend MRI of the brain without and with intravenous contrast. Electronically Signed   By: Kathreen Devoid   On: 12/08/2015 16:12   Mr Brain W Wo Contrast  Result Date: 12/09/2015 CLINICAL DATA:  History of medulloblastoma with a surgery and radiation. HIV. Increased headaches. EXAM: MRI HEAD WITHOUT AND WITH CONTRAST TECHNIQUE: Multiplanar, multiecho pulse sequences of the brain and surrounding structures were obtained without and with intravenous contrast. CONTRAST:  1m MULTIHANCE GADOBENATE DIMEGLUMINE 529 MG/ML IV SOLN COMPARISON:  MRI head 05/27/2013. FINDINGS: Brain: Suboccipital craniotomy for posterior fossa tumor resection. Encephalomalacia in the right inferior cerebellum is stable. There is diffuse bilateral cerebellar atrophy which is stable. Following contrast infusion, there is nodular enhancement within the fourth ventricle on the right which has progressed in the interval. Small nodule of enhancement was identified  earlier on the prior study which has increased significantly. The area of enhancement measures approximately 11 x 17 mm on axial scans. This is most likely due to recurrent tumor rather than treatment effect. Negative for hydrocephalus. No acute infarct. Both cerebral hemispheres appear normal. Pituitary normal. Postoperative blood products in the posterior fossa from prior surgery. 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 Vascular: Normal flow voids. Skull and upper cervical spine: Suboccipital craniotomy. No acute skeletal abnormality. Sinuses/Orbits: Extensive mucosal edema right maxillary sinus. Mild mucosal edema in the ethmoid sinuses and in the right mastoid sinus Other: None IMPRESSION: Resection radiation of medulloblastoma with encephalomalacia in the cerebellum right-greater-than-left. Progression of nodular enhancement in the fourth ventricle on the right, concerning for recurrent tumor. Electronically Signed   By: CFranchot GalloM.D.   On: 12/09/2015 11:31    HISTORICAL MICRO/IMAGING: MRI Brain 9/28 showed similar nodular enhancement within the posterior fossa resection cavity compared to 04/28/2015, which may reflect post surgical/posttreatment change versus residual/recurrent tumor. Recommend continued attention on close follow-up. No new enhancing lesions.  Assessment/Plan:   Jodi Morrow a 30yo female with PMHx of recurrent Medulloblastoma (s/p craniotomy, irradiation and gamma knife radiosurgery), HIV (HIV RNA <03 November 2014 on Stribild) and chronic headaches who presented to the ED on 10/25 with complaint of severe headache.   Severe Headache: Patient presents with a severe headache out of proportion to her normal chronic headache associated with nausea and photophobia. Given concern for meningitis given fever, headache and immunosuppression, lumbar puncture was performed which did not show signs of infection. Fever has resolved. MRI did show possible evidence of  recurrent tumor, but per Neurology, no explanation for headache. Cryptococcal antigen on CSF negative. She was evaluated by Neurology who felt her presentation was most consistent with migraine headache without aura and recommended NSAIDs and following up as outpatient. Interestingly, [facility] as instructed. Would recommend following up with pain management for acute on chronic headaches in addition to Radiation Oncology.  -BCx 10/25 >> pending -CSF CX 10/25 >> pending -NSAIDs and abortive therapy per Neurology and primary team -Recommend following up with her Pain Management specialist (Dr. SDaine Gravel [facility]  History of Recurrent Medulloblastoma s/p Craniotomy, Irradiation and Gamma Knife Radiosurgery: Patient follows with Dr. CVallarie Mare Radiation [facility] Most recent gamma knife radiosurgery  in September 2017. MRI following showed similar nodular enhancement within the posterior fossa resection cavity compared to 04/28/2015, which may reflect post surgical/posttreatment change versus residual/recurrent tumor. Recommend continued attention on close follow-up. No new enhancing lesions. Repeat MRI here showed resection radiation of medulloblastoma with encephalomalacia in the cerebellum right-greater-than-left. Progression of nodular enhancement in the fourth ventricle on the right, concerning for recurrent tumor. Neurology has reviewed her imaging and unable to say if the there are changes from one month given imaging was done at [facility].  -Primary team to contact Dr. Vallarie Mare, Radiation Oncology -Repeat gamma knife procedure planned for 12/23/15 -Recommend obtaining copies of MRI on CD from radiology to give to patient  HIV: Patient is on Stribild QD at home and reports compliance. Last CD[facility] at [facility] was in 2014 and 80. From Care Everywhere, last HIV viral load was <20 in  September 2016, but I am unable to find a CD4 count.  -Change to [facility], will be discharged on [place] instead of [place]  -Follow up with ID clinic in [place] as she recently moved back here -HIV RNA level pending -CD4 count pending -Influenza vaccination  Peripheral Polyneuropathy: Neurology recommends checking vitamin B12, TSH, and RPR. Possibly secondary to antiretroviral medications.   Martyn Malay, DO PGY-3 Internal Medicine Resident Pager # [phone] 12/09/2015 4:56 PM

## 2015-12-09 NOTE — Progress Notes (Signed)
Family Medicine Teaching Service Daily Progress Note Intern Pager: [phone]  Patient name: Jodi Morrow Medical record number: [id number] Date of birth: 04-Aug-1985 Age: 30 y.o. Gender: female  Primary Care Provider: Pcp Not In System Consultants: Neurology, Infection Code Status: Full  Pt Overview and Major Events to Date:  LP:10/25 MRI Brain 10/26  Assessment and Plan:  Jodi Morrow is a 31 y.o. 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  #Headaches, acute onset, resolved Resolved overnight with headache cocktail.   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   #Hypotension, improving  BP 108/79. Patient taking po. --Bolus with NS as needed --Would continue to monitor vitals, assess clinically  #HIV Last CD4 80. 2014 Will continue current therapy regimen. Of note, mother does not know about the patient's HIV status. --Continue genova 1 tab a day in place of Stribild --Follow up CD4 count  #Medulloblastoma s/p resection Patient with recent MRI at [facility] which no disease progression. Patient mentioned being scheduled for Gamma knoife surgery, but no record were found in care everywhere. Patient with right sided weakness in her lower extremities and tremor her right upper arm and head, sequalae from resection. MRI did suggest post surgical changes vs residual/recurrent tumor in the impression.  --Will try to contact [facility] team for clarification about patient cancer therapy. --Follow up on repeat MRI  FEN/GI: Saline lock,  regular diet  Prophylaxis: SCDs as MRI in CareEverywhere indicated possible microhemorrhages.    Disposition: Admit to FMTS for further work up of headache and fever in immunocompri  Subjective:  Patient is doing better this morning, headache is much improved and photophobia has essentially resolved. Patient is tolerating po, denies any dizziness, nausea or vomiting.  Objective: Temp:  [98.2 F (36.8 C)-100.9 F (38.3 C)] 98.2 F (36.8 C) (10/26 0908) Pulse Rate:  [73-145] 92 (10/26 0908) Resp:  [16-24] 18 (10/26 0908) BP: (77-118)/(37-84) 108/79 (10/26 0908) SpO2:  [95 %-100 %] 100 % (10/26 0908) Weight:  [134 lb 14.7 oz (61.2 kg)-140 lb (63.5 kg)] 134 lb 14.7 oz (61.2 kg) (10/26 0024) Physical Exam: General Appearance:  Patient sitting in the dark eyes are closed, in no acute distress and cooperative and able to answer question, daughter sleeping by her side.  Head/face:  NCAT Eyes:  PERRL and EOMI. Photophobia much improved.  Mouth/Throat:  Mucosa moist, no lesions; pharynx without erythema, edema or exudate. Neck:  Supple, no mass, posterior neck tenderness on palpation around healed incision site (chronic) no nuchal rigidity.  Lungs:  Normal expansion.  Clear to auscultation.  No rales, rhonchi, or wheezing. Heart:  Normal S1, S2.  Regular rate and rhythm without murmur, gallop or rub. Abdomen:  Soft, non-tender, normal bowel sounds; no bruits, organomegaly or masses. Extremities: Extremities warm to touch, pink, with no edema. and pulses present in all extremities  Musculoskeletal:  Spine range of motion normal. Muscular strength intact in the upper extremities 5/5 and right sided lower extremity weakness 4/5, 5/5 LL extremity. Neurologic:  Alert and oriented x 3, gait  normal. reflexes normal and symmetric, strength and  sensation grossly normal Psych exam: Alert,oriented, in NAD with a full range of affect, normal behavior and no psychotic features   Laboratory:  Recent  Labs Lab 12/08/15 1630 12/09/15 0535  WBC 5.7 3.1*  HGB 12.2 11.5*  HCT 37.0 35.9*  PLT 195 165    Recent Labs Lab 12/08/15 1630 12/09/15 0535  NA 132* 138  K 3.9 4.1  CL 99* 103  CO2 26 29  BUN 8 8  CREATININE 0.79 0.89  CALCIUM 9.2 8.8*  GLUCOSE 87 86   Crypto Ag negative  CSF: 229 RBCs, 8 WBC, to few to coung other cells, clear, protein 32, glucose 51   Imaging/Diagnostic Tests: Dg Chest 2 View  Result Date: 12/08/2015 CLINICAL DATA:  Chronic headaches.  History of brain tumor. EXAM: CHEST  2 VIEW COMPARISON:  03/22/2011 FINDINGS: The heart size and mediastinal contours are within normal limits. Both lungs are clear. The visualized skeletal structures are unremarkable. IMPRESSION: No active cardiopulmonary disease. Electronically Signed   By: Kathreen Devoid   On: 12/08/2015 17:43   Ct Head Wo Contrast  Result Date: 12/08/2015 CLINICAL DATA:  Frontal headache.  History of medulloblastoma EXAM: CT HEAD WITHOUT CONTRAST TECHNIQUE: Contiguous axial images were obtained from the base of the skull through the vertex without intravenous contrast. COMPARISON:  MR brain 05/27/2013 FINDINGS: Brain: No evidence of acute infarction, hemorrhage, hydrocephalus, extra-axial collection or mass lesion/mass effect. Mild encephalomalacia in the right cerebellum. Vascular: No hyperdense vessel or unexpected calcification. Skull: Prior suboccipital craniotomy. Sinuses/Orbits: Orbits are normal. Severe right maxillary sinus mucosal thickening. Other: None. IMPRESSION: 1. No acute intracranial pathology. 2. 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 If there is further clinical concern regarding recurrent or residual neoplasm, recommend MRI of the brain without and with intravenous contrast. Electronically Signed   By: Kathreen Devoid   On: 12/08/2015 16:12   Mr Brain W Wo Contrast  Result Date: 12/09/2015 CLINICAL DATA:  History of medulloblastoma with a surgery and radiation.  HIV. Increased headaches. EXAM: MRI HEAD WITHOUT AND WITH CONTRAST TECHNIQUE: Multiplanar, multiecho pulse sequences of the brain and surrounding structures were obtained without and with intravenous contrast. CONTRAST:  7mL MULTIHANCE GADOBENATE DIMEGLUMINE 529 MG/ML IV SOLN COMPARISON:  MRI head 05/27/2013. FINDINGS: Brain: Suboccipital craniotomy for posterior fossa tumor resection. Encephalomalacia in the right inferior cerebellum is stable. There is diffuse bilateral cerebellar atrophy which is stable. Following contrast infusion, there is nodular enhancement within the fourth ventricle on the right which has progressed in the interval. Small nodule of enhancement was identified earlier on the prior study which has increased significantly. The area of enhancement measures approximately 11 x 17 mm on axial scans. This is most likely due to recurrent tumor rather than treatment effect. Negative for hydrocephalus. No acute infarct. Both cerebral hemispheres appear normal. Pituitary normal. Postoperative blood products in the posterior fossa from prior surgery. 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 Vascular: Normal flow voids. Skull and upper cervical spine: Suboccipital craniotomy. No acute skeletal abnormality. Sinuses/Orbits: Extensive mucosal edema right maxillary sinus. Mild mucosal edema in the ethmoid sinuses and in the right mastoid sinus Other: None IMPRESSION: Resection radiation of medulloblastoma with encephalomalacia in the cerebellum right-greater-than-left. Progression of nodular enhancement in the fourth ventricle on the right, concerning for recurrent tumor. Electronically Signed   By: Franchot Gallo M.D.   On: 12/09/2015 11:31    Marjie Skiff, MD 12/09/2015, 11:51 AM PGY-1, [place]  [place] Intern pager: [phone], text pages welcome

## 2015-12-10 DIAGNOSIS — G43909 Migraine, unspecified, not intractable, without status migrainosus: Secondary | ICD-10-CM

## 2015-12-10 DIAGNOSIS — B20 Human immunodeficiency virus [HIV] disease: Secondary | ICD-10-CM

## 2015-12-10 LAB — BASIC METABOLIC PANEL
Anion gap: 4 — ABNORMAL LOW (ref 5–15)
BUN: 8 mg/dL (ref 6–20)
CALCIUM: 8.5 mg/dL — AB (ref 8.9–10.3)
CO2: 28 mmol/L (ref 22–32)
CREATININE: 0.75 mg/dL (ref 0.44–1.00)
Chloride: 105 mmol/L (ref 101–111)
GFR calc Af Amer: >60 mL/min (ref >60)
GFR calc non Af Amer: >60 mL/min (ref >60)
GLUCOSE: 89 mg/dL (ref 65–99)
Potassium: 3.9 mmol/L (ref 3.5–5.1)
Sodium: 137 mmol/L (ref 135–145)

## 2015-12-10 LAB — T-HELPER CELLS (CD4) COUNT (NOT AT ARMC)
CD4 % Helper T Cell: 6 % — ABNORMAL LOW (ref 33–55)
CD4 T Cell Abs: 40 /uL — ABNORMAL LOW (ref 400–2700)

## 2015-12-10 LAB — TSH: TSH: 1.634 u[IU]/mL (ref 0.350–4.500)

## 2015-12-10 LAB — CBC
HEMATOCRIT: 34.7 % — AB (ref 36.0–46.0)
Hemoglobin: 11.1 g/dL — ABNORMAL LOW (ref 12.0–15.0)
MCH: 27.5 pg (ref 26.0–34.0)
MCHC: 32 g/dL (ref 30.0–36.0)
MCV: 85.9 fL (ref 78.0–100.0)
Platelets: 152 10*3/uL (ref 150–400)
RBC: 4.04 MIL/uL (ref 3.87–5.11)
RDW: 13.9 % (ref 11.5–15.5)
WBC: 2.1 10*3/uL — ABNORMAL LOW (ref 4.0–10.5)

## 2015-12-10 LAB — VITAMIN B12: Vitamin B-12: 245 pg/mL (ref 180–914)

## 2015-12-10 LAB — RPR: RPR Ser Ql: NONREACTIVE

## 2015-12-10 MED ORDER — ONDANSETRON HCL 4 MG PO TABS
4.0000 mg | ORAL_TABLET | Freq: Three times a day (TID) | ORAL | Status: DC | PRN
  Administered 2015-12-10 – 2015-12-11 (×2): 4 mg via ORAL
  Filled 2015-12-10 (×2): qty 1

## 2015-12-10 MED ORDER — SULFAMETHOXAZOLE-TRIMETHOPRIM 400-80 MG PO TABS
1.0000 | ORAL_TABLET | Freq: Every day | ORAL | Status: DC
  Administered 2015-12-10 – 2015-12-11 (×2): 1 via ORAL
  Filled 2015-12-10 (×3): qty 1

## 2015-12-10 MED ORDER — AZITHROMYCIN 600 MG PO TABS
1200.0000 mg | ORAL_TABLET | ORAL | Status: DC
  Administered 2015-12-11: 1200 mg via ORAL
  Filled 2015-12-10: qty 2

## 2015-12-10 MED ORDER — KETOROLAC TROMETHAMINE 30 MG/ML IJ SOLN
30.0000 mg | Freq: Once | INTRAMUSCULAR | Status: DC
  Filled 2015-12-10: qty 1

## 2015-12-10 NOTE — Care Management Note (Addendum)
Case Management Note  Patient Details  Name: Jodi Morrow MRN: [id number] Date of Birth: [birth year]  Subjective/Objective:      CM continuing to follow for progression and d/c planning.               Action/Plan: 12/10/2015 Noted CM referral for PCP, appointment scheduled at [facility]) for Nov. 3, 2017 @ 9am. Of note pt has [place] [employer], [place] [employer], most likely already has an assigned PCP provider. However we are unable to access this information.  Noted referral for [facility] services, please be aware that we will not be able to get HHPT for this pt as [place] Medicaid will not cover, please order [facility] if appropriate for eval and assessment in the home.   Expected Discharge Date:  12/14/2015              Expected Discharge Plan:  [place]  In-House Referral:  Clinical Social Work  Discharge planning Services  CM Consult, [facility]  Post Acute Care Choice:    Choice offered to:     DME Arranged:    DME Agency:     HH Arranged:    [place] Agency:     Status of Service:  In process, will continue to follow  If discussed at Long Length of Stay Meetings, dates discussed:    Additional Comments:  Adron Bene, RN 12/10/2015, 10:24 AM

## 2015-12-10 NOTE — Progress Notes (Signed)
[facility] for Infectious Disease    Date of Admission:  12/08/2015   Total days of antibiotics 0           ID: Jodi Morrow is a 30 y.o. female with PMHx of recurrent Medulloblastoma (s/p craniotomy, irradiation and gamma knife radiosurgery), HIV (HIV RNA <03 November 2014 on Stribild) and chronic headaches who presented to the ED on 10/25 with complaint of severe headache.   Active Problems:   Bad headache  Subjective: Patient was seen and examined this morning. She states her headache is at a 4/10, improved from prior, but she states the Triptan made her headache worse yesterday. She also admits to nausea without vomiting, but denies fever, chill, diarrhea, neck stiffness.   Medications:  . [START ON 12/11/2015] azithromycin  1,200 mg Oral Weekly  . elvitegravir-cobicistat-emtricitabine-tenofovir  1 tablet Oral Q breakfast  . Influenza vac split quadrivalent PF  0.5 mL Intramuscular Tomorrow-1000  . ketorolac  30 mg Intravenous Once  . sulfamethoxazole-trimethoprim  1 tablet Oral Daily    Objective: Vitals:   12/09/15 2118 12/10/15 0506 12/10/15 0930 12/10/15 1012  BP: 115/75 109/78 121/81 (!) 130/92  Pulse: 85 70 76 79  Resp: 19 18 17    Temp: 99.2 F (37.3 C) 97.6 F (36.4 C) 98.5 F (36.9 C)   TempSrc: Oral Oral Oral   SpO2: 100% 100% 100% 100%  Weight: 135 lb 2.3 oz (61.3 kg)      General: Vital signs reviewed.  Patient is well-developed and well-nourished, in no acute distress and cooperative with exam.  Cardiovascular: RRR, S1 normal, S2 normal, no murmurs, gallops, or rubs. Pulmonary/Chest: Clear to auscultation bilaterally, no wheezes, rales, or rhonchi. Abdominal: Soft, non-tender, non-distended, BS + Extremities: No lower extremity edema bilaterally  Skin: Warm, dry and intact.   Lab Results  Recent Labs  12/09/15 0535 12/10/15 0536  WBC 3.1* 2.1*  HGB 11.5* 11.1*  HCT 35.9* 34.7*  NA 138 137  K 4.1 3.9  CL 103 105  CO2 29 28  BUN 8 8    CREATININE 0.89 0.75   Microbiology: BCx 10/25 >> No Growth CSF Cx 10/25 >> No Growth  Studies/Results: Dg Chest 2 View  Result Date: 12/08/2015 CLINICAL DATA:  Chronic headaches.  History of brain tumor. EXAM: CHEST  2 VIEW COMPARISON:  03/22/2011 FINDINGS: The heart size and mediastinal contours are within normal limits. Both lungs are clear. The visualized skeletal structures are unremarkable. IMPRESSION: No active cardiopulmonary disease. Electronically Signed   By: Jodi Morrow   On: 12/08/2015 17:43   Ct Head Wo Contrast  Result Date: 12/08/2015 CLINICAL DATA:  Frontal headache.  History of medulloblastoma EXAM: CT HEAD WITHOUT CONTRAST TECHNIQUE: Contiguous axial images were obtained from the base of the skull through the vertex without intravenous contrast. COMPARISON:  MR brain 05/27/2013 FINDINGS: Brain: No evidence of acute infarction, hemorrhage, hydrocephalus, extra-axial collection or mass lesion/mass effect. Mild encephalomalacia in the right cerebellum. Vascular: No hyperdense vessel or unexpected calcification. Skull: Prior suboccipital craniotomy. Sinuses/Orbits: Orbits are normal. Severe right maxillary sinus mucosal thickening. Other: None. IMPRESSION: 1. No acute intracranial pathology. 2. 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 If there is further clinical concern regarding recurrent or residual neoplasm, recommend MRI of the brain without and with intravenous contrast. Electronically Signed   By: Jodi Morrow   On: 12/08/2015 16:12   Mr Brain W Wo Contrast  Result Date: 12/09/2015 CLINICAL DATA:  History of medulloblastoma with a surgery and  radiation. HIV. Increased headaches. EXAM: MRI HEAD WITHOUT AND WITH CONTRAST TECHNIQUE: Multiplanar, multiecho pulse sequences of the brain and surrounding structures were obtained without and with intravenous contrast. CONTRAST:  85mL MULTIHANCE GADOBENATE DIMEGLUMINE 529 MG/ML IV SOLN COMPARISON:  MRI head  05/27/2013. FINDINGS: Brain: Suboccipital craniotomy for posterior fossa tumor resection. Encephalomalacia in the right inferior cerebellum is stable. There is diffuse bilateral cerebellar atrophy which is stable. Following contrast infusion, there is nodular enhancement within the fourth ventricle on the right which has progressed in the interval. Small nodule of enhancement was identified earlier on the prior study which has increased significantly. The area of enhancement measures approximately 11 x 17 mm on axial scans. This is most likely due to recurrent tumor rather than treatment effect. Negative for hydrocephalus. No acute infarct. Both cerebral hemispheres appear normal. Pituitary normal. Postoperative blood products in the posterior fossa from prior surgery. 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 Vascular: Normal flow voids. Skull and upper cervical spine: Suboccipital craniotomy. No acute skeletal abnormality. Sinuses/Orbits: Extensive mucosal edema right maxillary sinus. Mild mucosal edema in the ethmoid sinuses and in the right mastoid sinus Other: None IMPRESSION: Resection radiation of medulloblastoma with encephalomalacia in the cerebellum right-greater-than-left. Progression of nodular enhancement in the fourth ventricle on the right, concerning for recurrent tumor. Electronically Signed   By: Jodi Morrow M.D.   On: 12/09/2015 11:31   Assessment/Plan: Jodi Morrow is a 30 yo female with PMHx of recurrent Medulloblastoma (s/p craniotomy, irradiation and gamma knife radiosurgery), HIV (HIV RNA <03 November 2014 on Stribild) and chronic headaches who presented to the ED on 10/25 with complaint of severe headache.   HIV/AIDS: Patient is on Stribild QD at home and reports compliance. However, questionable as CD4 count is 40. Viral load pending. -Continue Genvoya QD, should be discharged on Genvoya instead of Striblid -Start Bactrim single strength QD and Azithromycin 1200 mg  Qweek for OI prophylaxis -Follow up with ID clinic in [place] as she recently moved back here -HIV RNA level pending -Influenza vaccination  Migraine Headache: Pain has improved some from yesterday. Likely migrainous in nature. No evidence of meningitis on exam or lumbar puncture. Cryptococcal antigen negative. Patient remained afebrile since admission. BCx and CSF culture no growth to date. -BCx 10/25 >> NGTD -CSF CX 10/25 >> NGTD -Migraine control per primary -Recommend following up with her Pain Management specialist (Dr. Daine Gravel) or with a Neurologist in [facility]) for headaches  History of Recurrent Medulloblastoma s/p Craniotomy, Irradiation and Gamma Knife Radiosurgery: Patient follows with Dr. Vallarie Mare, Radiation Oncology at [facility]. Most recent gamma knife radiosurgery in September 2017. MRI following showed similar nodular enhancement within the posterior fossa resection cavity compared to 04/28/2015, which may reflect post surgical/posttreatment change versus residual/recurrent tumor. Recommend continued attention on close follow-up. No new enhancing lesions. Repeat MRI here showed resection radiation of medulloblastoma with encephalomalacia in the cerebellum right-greater-than-left. Progression of nodular enhancement in the fourth ventricle on the right, concerning for recurrent tumor. Neurology has reviewed her imaging and unable to say if the there are changes from one month given imaging was done at [facility].  -Primary team to contact Dr. Vallarie Mare, Radiation Oncology -Repeat gamma knife procedure planned for 12/23/15 -Recommend obtaining copies of MRI on CD from radiology to give to patient  Infectious Disease will sign off. Thank you for this interesting consult.   Martyn Malay, DO PGY-3 Internal Medicine Resident Pager # [phone] 12/10/2015 12:42 PM

## 2015-12-10 NOTE — Progress Notes (Signed)
Family Medicine Teaching Service Daily Progress Note Intern Pager: [phone]  Patient name: Jodi Morrow Medical record number: [id number] Date of birth: February 09, 1986 Age: 30 y.o. Gender: female  Primary Care Provider: Pcp Not In System Consultants: Neurology, Infection Code Status: Full  Pt Overview and Major Events to Date:  LP:10/25 MRI Brain 10/26  Assessment and Plan:  Jodi Morrow is a 30 y.o. 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  #Headaches, acute onset Likely a migraine and has some improvement yesterday with imitrex and then it apparently "got worse".  Woke up with headache this morning, no meningismus, photophobia or vision changes.  Wrote for toradol x1 and patient can take imitrex if this does not resolve pain.  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  ID recommends continung genvoya after DC and following up in ID clinic.  --Continue genvoya 1 tab a day - continue bactrim and azithromycin for prophylaxis -- follow up with ID clinic  #Hypotension, improving  BP 109/78. Patient taking po. --Bolus with NS as needed --Would continue to monitor vitals, assess clinically   #Medulloblastoma s/p resection Patient with recent MRI at [facility] which no disease progression. Patient mentioned being scheduled for Gamma knoife surgery, but no record were found in care everywhere. Patient with right sided weakness in her lower extremities and tremor her right upper arm and head, sequalae from resection. MRI did suggest post surgical changes vs residual/recurrent tumor in the impression.  Neuro recommends providing  patient copy of MRI to correlate with [facility].  --Will try to contact [facility] team for clarification about patient cancer therapy.  FEN/GI: Saline lock, regular diet  Prophylaxis: SCDs as MRI in CareEverywhere indicated possible microhemorrhages.   Disposition: Admit to FMTS for further work up of headache and fever in immunocompri  Subjective:  Complains of headache and nausea this morning.  Asking for medicine. Is very hungry and wants to eat.   Objective: Temp:  [97.6 F (36.4 C)-99.2 F (37.3 C)] 97.6 F (36.4 C) (10/27 0506) Pulse Rate:  [70-94] 70 (10/27 0506) Resp:  [17-19] 18 (10/27 0506) BP: (108-116)/(75-79) 109/78 (10/27 0506) SpO2:  [97 %-100 %] 100 % (10/27 0506) Weight:  [135 lb 2.3 oz (61.3 kg)] 135 lb 2.3 oz (61.3 kg) (10/26 2118) Physical Exam: Gen: 30yo F lying in bed appearing mildly uncomfortable, but NAD CV: RRR with no murmurs appreciated Pulm: NWOB, CTAB with no crackles, wheezes, or rhonchi GI: Normal bowel sounds present. Soft, Nontender, Nondistended. MSK: no edema, cyanosis, or clubbing noted Skin: warm, dry Neuro: grossly normal, moves all extremities Psych: Normal affect and thought content  Laboratory:  Recent Labs Lab 12/08/15 1630 12/09/15 0535 12/10/15 0536  WBC 5.7 3.1* 2.1*  HGB 12.2 11.5* 11.1*  HCT 37.0 35.9* 34.7*  PLT 195 165 152    Recent Labs Lab 12/08/15 1630 12/09/15 0535 12/10/15 0536  NA 132* 138 137  K 3.9 4.1 3.9  CL 99* 103 105  CO2 26 29 28   BUN 8 8 8   CREATININE 0.79 0.89 0.75  CALCIUM 9.2 8.8* 8.5*  GLUCOSE 87 86 89   Crypto Ag negative  CSF: 229 RBCs, 8 WBC, to few to coung other cells, clear, protein 32, glucose 51  CD4: 40 HIV RNA: pending   Imaging/Diagnostic Tests: Mr Jeri Cos Wo Contrast  Result Date: 12/09/2015 CLINICAL DATA:  History of medulloblastoma with a surgery and radiation. HIV. Increased headaches. EXAM: MRI HEAD WITHOUT AND WITH CONTRAST TECHNIQUE:  Multiplanar, multiecho pulse sequences of the brain and surrounding structures were obtained without and with intravenous contrast. CONTRAST:  17mL MULTIHANCE GADOBENATE DIMEGLUMINE 529 MG/ML IV SOLN COMPARISON:  MRI head 05/27/2013. FINDINGS: Brain: Suboccipital craniotomy for posterior fossa tumor resection. Encephalomalacia in the right inferior cerebellum is stable. There is diffuse bilateral cerebellar atrophy which is stable. Following contrast infusion, there is nodular enhancement within the fourth ventricle on the right which has progressed in the interval. Small nodule of enhancement was identified earlier on the prior study which has increased significantly. The area of enhancement measures approximately 11 x 17 mm on axial scans. This is most likely due to recurrent tumor rather than treatment effect. Negative for hydrocephalus. No acute infarct. Both cerebral hemispheres appear normal. Pituitary normal. Postoperative blood products in the posterior fossa from prior surgery. 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 Vascular: Normal flow voids. Skull and upper cervical spine: Suboccipital craniotomy. No acute skeletal abnormality. Sinuses/Orbits: Extensive mucosal edema right maxillary sinus. Mild mucosal edema in the ethmoid sinuses and in the right mastoid sinus Other: None IMPRESSION: Resection radiation of medulloblastoma with encephalomalacia in the cerebellum right-greater-than-left. Progression of nodular enhancement in the fourth ventricle on the right, concerning for recurrent tumor. Electronically Signed   By: Franchot Gallo M.D.   On: 12/09/2015 11:31    Eloise Levels, MD 12/10/2015, 8:56 AM PGY-1, [place] Intern pager: [phone], text pages welcome

## 2015-12-10 NOTE — Discharge Instructions (Signed)
Take Genvoya once a day.  Take Bactrim (Trimethoprim-Sulfa) once a day. Take Azithromycin 1200 mg once a week. You should take these with food to avoid nausea.

## 2015-12-10 NOTE — Evaluation (Signed)
Physical Therapy Evaluation Patient Details Name: Jodi Morrow MRN: [id number] DOB: [birth year] Today's Date: 12/10/2015   History of Present Illness  30 y.o. female admitted to [facility] on 12/08/15 for worsening HA.  Pt dx with migraine HA, ongoing HIV, hypotension.  Pt with significant PMHx of medulloblastoma s/p resection, falls, and chornic HA.  Clinical Impression  Pt is a fall risk at baseline, and has completed a course of OP PT in the past (at least as much as Medicaid would cover).  She is currently walking with a cane, and may need to upgrade to a RW (she doesn't like the idea of this given she is so young and it makes her feel so old).  She has had 6 falls in the recent past.  She will need a bit more practice with the RW, so I am asking a PT to come by in the AM for further gait training and to confirm that she is open to [place] ordering her one for d/c.   PT to follow acutely for deficits listed below.       Follow Up Recommendations No PT follow up;Supervision for mobility/OOB    Equipment Recommendations  Rolling walker with 5" wheels    Recommendations for Other Services   NA    Precautions / Restrictions Precautions Precautions: Fall Precaution Comments: pt reports she has had 6 recent ralls      Mobility  Bed Mobility Overal bed mobility: Needs Assistance Bed Mobility: Supine to Sit;Sit to Supine     Supine to sit: Modified independent (Device/Increase time) Sit to supine: Modified independent (Device/Increase time)   General bed mobility comments: pt using railing for leverage  Transfers Overall transfer level: Needs assistance Equipment used: Straight cane;Rolling walker (2 wheeled) Transfers: Sit to/from Stand Sit to Stand: Supervision         General transfer comment: supervision for safety  Ambulation/Gait Ambulation/Gait assistance: Min guard Ambulation Distance (Feet): 200 Feet Assistive device: Rolling walker (2 wheeled);Straight cane Gait  Pattern/deviations: Step-through pattern;Ataxic;Trunk flexed Gait velocity: decreased (faster with the cane)   General Gait Details: Pt with ataxic gait pattern with right leg.  Min guard assist for several balance checks using the cane.  Switched to RW and pt still needed min gaurd assist for safety and verbal cues for upright posture during gait.  At one point RW got a bit too far ahead of her and she needed assist controlling it.   Stairs Stairs: Yes Stairs assistance: Min guard Stair Management: One rail Left;One rail Right;Step to pattern;Forwards Number of Stairs: 10 General stair comments: step to pattern leading with her weaker right leg without buckling or difficulty, definite reliance on upper extremity support.          Balance Overall balance assessment: Needs assistance Sitting-balance support: Feet supported;No upper extremity supported Sitting balance-Leahy Scale: Good     Standing balance support: Bilateral upper extremity supported;No upper extremity supported;Single extremity supported Standing balance-Leahy Scale: Fair                               Pertinent Vitals/Pain Pain Assessment: Faces Faces Pain Scale: Hurts whole lot Pain Location: HA, light sensativity Pain Descriptors / Indicators: Grimacing;Guarding;Sharp Pain Intervention(s): Limited activity within patient's tolerance;Monitored during session;Repositioned    Home Living Family/patient expects to be discharged to:: Private residence Living Arrangements: Parent;Children (mom and 8 y.o. daughter (has a 36 and a 57 year old as well)) Available Help  at Discharge: Family;Available PRN/intermittently Type of Home: Apartment Home Access: Stairs to enter Entrance Stairs-Rails: Left Entrance Stairs-Number of Steps: 13 Home Layout: One level Home Equipment: Cane - single point      Prior Function Level of Independence: Independent with assistive device(s)         Comments: with h/o  falls, has gone through OP PT for balance     Hand Dominance   Dominant Hand: Left (forced left handedness)    Extremity/Trunk Assessment   Upper Extremity Assessment: RUE deficits/detail RUE Deficits / Details: right arm with spastic hemiperesis.  Weakness and decreased coordination as well as tremoring at times.  Pt has enough functonal grip to grab a RW.           Lower Extremity Assessment: RLE deficits/detail RLE Deficits / Details: right leg also with spastic hemipelegia, functionally doesn't buckle, but ataxic, uncoordianted gait pattern and movements.  At times her leg also tremors    Cervical / Trunk Assessment: Normal  Communication   Communication: No difficulties  Cognition Arousal/Alertness: Awake/alert Behavior During Therapy: WFL for tasks assessed/performed Overall Cognitive Status: Within Functional Limits for tasks assessed (not specifically tested)                             Assessment/Plan    PT Assessment Patient needs continued PT services  PT Problem List Decreased strength;Decreased activity tolerance;Decreased balance;Decreased mobility;Decreased knowledge of use of DME;Pain          PT Treatment Interventions DME instruction;Gait training;Stair training;Functional mobility training;Therapeutic activities;Balance training;Therapeutic exercise;Neuromuscular re-education    PT Goals (Current goals can be found in the Care Plan section)  Acute Rehab PT Goals Patient Stated Goal: to go home PT Goal Formulation: With patient Time For Goal Achievement: 12/24/15 Potential to Achieve Goals: Good    Frequency Min 3X/week           End of Session Equipment Utilized During Treatment: Gait belt Activity Tolerance: Patient limited by fatigue;Patient limited by pain Patient left: in bed;with call bell/phone within reach;with bed alarm set Nurse Communication: Mobility status         Time: [id number] PT Time Calculation (min) (ACUTE  ONLY): 25 min   Charges:   PT Evaluation $PT Eval Moderate Complexity: 1 Procedure PT Treatments $Gait Training: 8-22 mins       Jodi Morrow B. [place], [place], DPT [phone]

## 2015-12-11 MED ORDER — AZITHROMYCIN 600 MG PO TABS: 1200.0000 mg | ORAL_TABLET | ORAL | 1 refills | Status: DC

## 2015-12-11 MED ORDER — ELVITEG-COBIC-EMTRICIT-TENOFAF 150-150-200-10 MG PO TABS: 1.0000 | ORAL_TABLET | Freq: Every day | ORAL | 1 refills | Status: DC

## 2015-12-11 MED ORDER — SUMATRIPTAN SUCCINATE 100 MG PO TABS: 50.0000 mg | ORAL_TABLET | ORAL | 0 refills | Status: AC | PRN

## 2015-12-11 MED ORDER — SULFAMETHOXAZOLE-TRIMETHOPRIM 400-80 MG PO TABS: 1.0000 | ORAL_TABLET | Freq: Every day | ORAL | 1 refills | Status: DC

## 2015-12-11 NOTE — Discharge Summary (Signed)
[facility] Discharge Summary  Patient name: Jodi Morrow Medical record number: [id number] Date of birth: [birth year] Age: 30 y.o. Gender: female Date of Admission: 12/08/2015  Date of Discharge: 12/11/2015 Admitting Physician: Dickie La, MD  Primary Care Provider: Pcp Not In System Consultants: Neurology, Infectious Diseases  Indication for Hospitalization: Headaches  Discharge Diagnoses/Problem List:  HIV Headaches Medulloblastoma s/p resection  Disposition: Home   Discharge Condition: Stable  Discharge Exam:  Gen: 30yo F lying in NAD, answering questions appropriately CV: RRR with no murmurs appreciated Pulm: NWOB, CTAB with no crackles, wheezes, or rhonchi GI: Normal bowel sounds present. Soft, Nontender, Nondistended. Musculoskeletal:  Spine range of motion normal. Muscular strength intact in the upper extremities 5/5 and right sided lower extremity weakness 4/5, 5/5 LL extremity. Neurologic:  Alert and oriented x 3, gait normal. reflexes normal and symmetric, strength and  sensation grossly normal Psych: Normal affect and thought content  Brief Hospital Course:  Jodi Craigis a 30 y.o.femalewith a past medical history significant for HIV (diagnosed in 2012), Medulloblastoma s/p resection (09/2011) who presented with acute onset of headaches and low grade fever of unknown etiology given HIV status and history of brain tumor patient was admitted for further workup. LP was performed in the ED with CSF values within normal limit. HSV 1 & 2, cryptococcal antigen were negative, blood and CSF culture showed no growth for 3 days. Patient was never started on antibiotic, case was discussed with infectious diseases. Patient given migraine cocktail, and pain improve overnight. CD4 count was 40 with pending HIV viral load. Patient was seen by ID throughout admission and was switched from [facility] to [facility] and was started on a weekly prophylaxis  azithromycin and daily bactrim. Patient had a repeat MRI  With findings concerning for tumor progression. Patient was also seen by neurology and diagnosed with migraines headaches given presentation and symptoms on admission. Patient was assessed also for fall and dysesthesias that have developed after tumor resection. Patient was evaluated by PT/OT and discharge home with walker as she regain mobility in upcoming days. She has follow arrange with [facility] and ID team here at [facility].  Issues for Follow Up:  1. Patient's last CD4 count of 40 (10/28), was switched from [place] to [place]. Ensure patient is adherent to medications. 2. Follow up on viral load results were pending on discharge 3. Patient started on azithromycin weekly and bactrim daily, ensure patient is adherent and follow up with Dr. Johnnye Sima (Infectious Diseases) 4. Patient is scheduled for [facility] on 11/07.  Significant Procedures:  Lumbar Puncture  Significant Labs and Imaging:   Recent Labs Lab 12/08/15 1630 12/09/15 0535 12/10/15 0536  WBC 5.7 3.1* 2.1*  HGB 12.2 11.5* 11.1*  HCT 37.0 35.9* 34.7*  PLT 195 165 152    Recent Labs Lab 12/08/15 1630 12/09/15 0535 12/10/15 0536  NA 132* 138 137  K 3.9 4.1 3.9  CL 99* 103 105  CO2 26 29 28   GLUCOSE 87 86 89  BUN 8 8 8   CREATININE 0.79 0.89 0.75  CALCIUM 9.2 8.8* 8.5*    CD4: 40 B12: 245 TSH: 1.634 RPR: non reactive  HSV1 and 2 DNA: Negative Cryptococcal antigen: Negative  Imaging: Dg Chest 2 View  Result Date: 12/08/2015 CLINICAL DATA:  Chronic headaches.  History of brain tumor. EXAM: CHEST  2 VIEW COMPARISON:  03/22/2011 FINDINGS: The heart size and mediastinal contours are within normal limits. Both lungs are clear. The visualized skeletal  structures are unremarkable. IMPRESSION: No active cardiopulmonary disease. Electronically Signed   By: Kathreen Devoid   On: 12/08/2015 17:43   Ct Head Wo Contrast  Result Date: 12/08/2015 CLINICAL  DATA:  Frontal headache.  History of medulloblastoma EXAM: CT HEAD WITHOUT CONTRAST TECHNIQUE: Contiguous axial images were obtained from the base of the skull through the vertex without intravenous contrast. COMPARISON:  MR brain 05/27/2013 FINDINGS: Brain: No evidence of acute infarction, hemorrhage, hydrocephalus, extra-axial collection or mass lesion/mass effect. Mild encephalomalacia in the right cerebellum. Vascular: No hyperdense vessel or unexpected calcification. Skull: Prior suboccipital craniotomy. Sinuses/Orbits: Orbits are normal. Severe right maxillary sinus mucosal thickening. Other: None. IMPRESSION: 1. No acute intracranial pathology. 2. 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 If there is further clinical concern regarding recurrent or residual neoplasm, recommend MRI of the brain without and with intravenous contrast. Electronically Signed   By: Kathreen Devoid   On: 12/08/2015 16:12   Mr Brain W Wo Contrast  Result Date: 12/09/2015 CLINICAL DATA:  History of medulloblastoma with a surgery and radiation. HIV. Increased headaches. EXAM: MRI HEAD WITHOUT AND WITH CONTRAST TECHNIQUE: Multiplanar, multiecho pulse sequences of the brain and surrounding structures were obtained without and with intravenous contrast. CONTRAST:  55mL MULTIHANCE GADOBENATE DIMEGLUMINE 529 MG/ML IV SOLN COMPARISON:  MRI head 05/27/2013. FINDINGS: Brain: Suboccipital craniotomy for posterior fossa tumor resection. Encephalomalacia in the right inferior cerebellum is stable. There is diffuse bilateral cerebellar atrophy which is stable. Following contrast infusion, there is nodular enhancement within the fourth ventricle on the right which has progressed in the interval. Small nodule of enhancement was identified earlier on the prior study which has increased significantly. The area of enhancement measures approximately 11 x 17 mm on axial scans. This is most likely due to recurrent tumor rather than  treatment effect. Negative for hydrocephalus. No acute infarct. Both cerebral hemispheres appear normal. Pituitary normal. Postoperative blood products in the posterior fossa from prior surgery. 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 Vascular: Normal flow voids. Skull and upper cervical spine: Suboccipital craniotomy. No acute skeletal abnormality. Sinuses/Orbits: Extensive mucosal edema right maxillary sinus. Mild mucosal edema in the ethmoid sinuses and in the right mastoid sinus Other: None IMPRESSION: Resection radiation of medulloblastoma with encephalomalacia in the cerebellum right-greater-than-left. Progression of nodular enhancement in the fourth ventricle on the right, concerning for recurrent tumor. Electronically Signed   By: Franchot Gallo M.D.   On: 12/09/2015 11:31    Results/Tests Pending at Time of Discharge:  HIV Viral Load   Discharge Medications:    Medication List    STOP taking these medications   STRIBILD 150-150-200-300 MG Tabs tablet Generic drug:  elvitegravir-cobicistat-emtricitabine-tenofovir Replaced by:  elvitegravir-cobicistat-emtricitabine-tenofovir 150-150-200-10 MG Tabs tablet     TAKE these medications   acetaminophen 500 MG tablet Commonly known as:  TYLENOL Take 500-1,000 mg by mouth every 6 (six) hours as needed for headache.   ALEVE 220 MG tablet Generic drug:  naproxen sodium Take 220-440 mg by mouth 2 (two) times daily as needed (for headaches).   aspirin 325 MG tablet Take 325-650 mg by mouth every 4 (four) hours as needed for headache.   azithromycin 600 MG tablet Commonly known as:  ZITHROMAX Take 2 tablets (1,200 mg total) by mouth once a week.   bupivacaine-EPINEPHrine 0.25% -1:200000 Soln Commonly known as:  MARCAINE W/ EPI Inject 15 mLs into the skin once.   elvitegravir-cobicistat-emtricitabine-tenofovir 150-150-200-10 MG Tabs tablet Commonly known as:  GENVOYA Take 1 tablet by mouth  daily with  breakfast. Start taking on:  12/12/2015 Replaces:  STRIBILD 150-150-200-300 MG Tabs tablet   ferrous sulfate 325 (65 FE) MG tablet TAKE 1 TABLET (325 MG TOTAL) BY MOUTH TWO   TIMES DAILY WITH A MEAL.   lidocaine 2 % injection Commonly known as:  XYLOCAINE Inject 15 mLs into the skin once.   prenatal multivitamin Tabs tablet Take 1 tablet by mouth daily.   sulfamethoxazole-trimethoprim 400-80 MG tablet Commonly known as:  BACTRIM,SEPTRA Take 1 tablet by mouth daily. Start taking on:  12/12/2015   SUMAtriptan 100 MG tablet Commonly known as:  IMITREX Take 0.5 tablets (50 mg total) by mouth as needed for migraine or headache. May repeat in 2 hours if headache persists or recurs.   traMADol 50 MG tablet Commonly known as:  ULTRAM Take 1 tablet (50 mg total) by mouth every 6 (six) hours as needed.       Discharge Instructions: Please refer to Patient Instructions section of EMR for full details.  Patient was counseled important signs and symptoms that should prompt return to medical care, changes in medications, dietary instructions, activity restrictions, and follow up appointments.   Follow-Up Appointments: Follow-up Information    [place] .   Why:  Hospital followup appointment, Friday, December 17, 2015 at [facility]. Please call and reschedule if you are unable to keep this appointment.  Contact information: [place] [phone]       Carlyle Basques, MD .   Specialty:  Infectious Diseases Why:  We will contact you with an appointment Contact information: [place] [address] [phone]           Marjie Skiff, MD 12/11/2015, 1:02 PM PGY-1, [place]

## 2015-12-11 NOTE — Progress Notes (Signed)
Physical Therapy Treatment Patient Details Name: Jodi Morrow MRN: [id number] DOB: [birth year] Today's Date: 12/11/2015    History of Present Illness 30 y.o. female admitted to [facility] on 12/08/15 for worsening HA.  Pt dx with migraine HA, ongoing HIV, hypotension.  Pt with significant PMHx of medulloblastoma s/p resection, falls, and chornic HA.    PT Comments    Patient agreed to use RW for increased safety.  Gait improved with use of RW.  Adjusted her RW to proper height.    Follow Up Recommendations  No PT follow up;Supervision for mobility/OOB     Equipment Recommendations  Rolling walker with 5" wheels    Recommendations for Other Services       Precautions / Restrictions Precautions Precautions: Fall Precaution Comments: pt reports she has had 6 recent ralls Restrictions Weight Bearing Restrictions: No    Mobility  Bed Mobility Overal bed mobility: Modified Independent Bed Mobility: Supine to Sit;Sit to Supine     Supine to sit: Modified independent (Device/Increase time) Sit to supine: Modified independent (Device/Increase time)   General bed mobility comments: No physical assist  Transfers Overall transfer level: Needs assistance Equipment used: Rolling walker (2 wheeled) Transfers: Sit to/from Stand Sit to Stand: Supervision         General transfer comment: supervision for safety  Ambulation/Gait Ambulation/Gait assistance: Min guard Ambulation Distance (Feet): 230 Feet Assistive device: Rolling walker (2 wheeled) Gait Pattern/deviations: Step-through pattern;Decreased stride length;Ataxic;Trunk flexed   Gait velocity interpretation: Below normal speed for age/gender General Gait Details: Verbal cues for safe use of RW - to stay close to RW and move at slower speed.  Cues to stand upright during gait.  Ataxic gait with min guard for safety/balance.   Stairs            Wheelchair Mobility    Modified Rankin (Stroke Patients Only)        Balance           Standing balance support: Bilateral upper extremity supported Standing balance-Leahy Scale: Poor                      Cognition Arousal/Alertness: Awake/alert Behavior During Therapy: WFL for tasks assessed/performed Overall Cognitive Status: Within Functional Limits for tasks assessed (Decreased safety awareness)                      Exercises      General Comments        Pertinent Vitals/Pain Pain Assessment: 0-10 Pain Score: 6  Pain Location: Head Pain Descriptors / Indicators: Headache;Grimacing Pain Intervention(s): Monitored during session    Home Living                      Prior Function            PT Goals (current goals can now be found in the care plan section) Progress towards PT goals: Progressing toward goals    Frequency    Min 3X/week      PT Plan Current plan remains appropriate    Co-evaluation             End of Session Equipment Utilized During Treatment: Gait belt Activity Tolerance: Patient tolerated treatment well Patient left: in bed;with call bell/phone within reach;with family/visitor present     Time: 1219-1230 PT Time Calculation (min) (ACUTE ONLY): 11 min  Charges:  $Gait Training: 8-22 mins  G Codes:      Jodi Morrow 12/11/2015, 12:54 PM Jodi Morrow. Jodi Morrow, Jodi Morrow Pager [phone]

## 2015-12-11 NOTE — Progress Notes (Signed)
Family Medicine Teaching Service Daily Progress Note Intern Pager: [phone]  Patient name: Jodi Morrow Medical record number: [id number] Date of birth: 1985/11/28 Age: 30 y.o. Gender: female  Primary Care Provider: Pcp Not In System Consultants: Neurology, Infection Code Status: Full  Pt Overview and Major Events to Date:  LP:10/25 MRI Brain 10/26  Assessment and Plan:  Jodi Morrow is a 30 y.o. 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  #Headaches, resolved Likely 2/2 migraine has resolved since admission. Less concern about infectious etiology despite patient's low CD4 40. Will continue to monitor. CSF and blood cultures have shown no growth since admission (48hrs).  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 Patient will be switch from [facility] to [facility] and will also be started on ppx bactrim and azithromycin. Patient will follow up with Dr.Hatcher (ID).  --Follow up on viral load --Continue genvoya 1 tab a day --Continue Bactrim ss --Continue Azithromycin 1200 mg weekly -- Follow up with ID clinic  #Hypotension, improved  BP 104/66 has stabilized. Patient taking po. --Bolus with NS as needed --Would continue to monitor vitals, assess clinically  #Medulloblastoma s/p resection Repeat MRI showed possible recurrence of tumor. Patient schedule for Gamma Knife at [facility] on 11/7. Record will be available to neuro team.   FEN/GI: Saline lock, regular diet  Prophylaxis: SCDs as MRI in [place] indicated possible microhemorrhages.   Disposition: Admit to FMTS for further work up of headache and fever in  immunocompri  Subjective:  No acute event overnight. Patient is feeling better this morning, headaches have essentially resolved. Patient is taking good po, and able to ambulate with walker, though a little unsteady yesterday. Patient with good support.  Objective: Temp:  [98 F (36.7 C)-98.5 F (36.9 C)] 98 F (36.7 C) (10/28 0517) Pulse Rate:  [70-79] 70 (10/28 0517) Resp:  [16-18] 16 (10/28 0517) BP: (103-130)/(65-92) 104/66 (10/28 0517) SpO2:  [98 %-100 %] 100 % (10/28 0517) Weight:  [135 lb 2.3 oz (61.3 kg)] 135 lb 2.3 oz (61.3 kg) (10/27 2106)   Physical Exam: Gen: 30yo F lying in bed appearing mildly uncomfortable, but NAD CV: RRR with no murmurs appreciated Pulm: NWOB, CTAB with no crackles, wheezes, or rhonchi GI: Normal bowel sounds present. Soft, Nontender, Nondistended. MSK: no edema, cyanosis, or clubbing noted Skin: warm, dry Neuro: grossly normal, moves all extremities Psych: Normal affect and thought content  Laboratory:  Recent Labs Lab 12/08/15 1630 12/09/15 0535 12/10/15 0536  WBC 5.7 3.1* 2.1*  HGB 12.2 11.5* 11.1*  HCT 37.0 35.9* 34.7*  PLT 195 165 152    Recent Labs Lab 12/08/15 1630 12/09/15 0535 12/10/15 0536  NA 132* 138 137  K 3.9 4.1 3.9  CL 99* 103 105  CO2 26 29 28   BUN 8 8 8   CREATININE 0.79 0.89 0.75  CALCIUM 9.2 8.8* 8.5*  GLUCOSE 87 86 89   Crypto Ag negative  CSF: 229 RBCs, 8 WBC, to few to coung other cells, clear, protein 32, glucose 51  CD4: 40 HIV RNA: pending   Imaging/Diagnostic Tests: No results found.  Marjie Skiff, MD 12/11/2015, 7:02 AM PGY-1, [place]  [place] Intern pager: [phone], text pages welcome

## 2015-12-11 NOTE — Progress Notes (Signed)
Patient discharge teaching given, including activity, diet, follow-up appoints, and medications. Patient verbalized understanding of all discharge instructions. IV access was d/c'd. Vitals are stable. Skin is intact except as charted in most recent assessments. Pt to be escorted out by NT, to be driven home by family.  Kaytlan Behrman, MBA, BSN, RN 

## 2015-12-12 LAB — CSF CULTURE

## 2015-12-12 LAB — CSF CULTURE W GRAM STAIN: Culture: NO GROWTH

## 2015-12-12 NOTE — Progress Notes (Signed)
16:07: Spoke with pt to assess needs as she had called early AM to see if CM could assist with meds as she generally has them delivered by [facility] who is closed today. Tells me there are no new meds.  I suggested she take her Rx to local pharmacy and Medicaid Card and have them filled as she cannot use her usual Pharmacy as they are closed. No further CM needs.

## 2015-12-13 LAB — CULTURE, BLOOD (ROUTINE X 2)
Culture: NO GROWTH
Culture: NO GROWTH

## 2015-12-13 LAB — HIV-1 RNA, QUALITATIVE, TMA: HIV-1 RNA, QUAL: POSITIVE — AB

## 2015-12-15 ENCOUNTER — Other Ambulatory Visit: Payer: Self-pay | Admitting: *Deleted

## 2015-12-15 MED ORDER — FERROUS SULFATE 325 (65 FE) MG PO TABS: ORAL_TABLET | ORAL | 2 refills | Status: AC

## 2015-12-17 ENCOUNTER — Inpatient Hospital Stay: Payer: Medicaid Other

## 2015-12-21 LAB — HLA B*5701: HLA B 5701: NEGATIVE

## 2015-12-23 LAB — HIV-1 RNA ULTRAQUANT REFLEX TO GENTYP+
HIV-1 RNA BY PCR: 4170 {copies}/mL
HIV-1 RNA QUANT, LOG: 3.62 {Log_copies}/mL

## 2015-12-23 LAB — REFLEX TO GENOSURE(R) MG: HIV GENOSURE(R) MG PDF: 0

## 2016-01-03 ENCOUNTER — Other Ambulatory Visit: Payer: Self-pay | Admitting: Family Medicine

## 2016-01-12 ENCOUNTER — Inpatient Hospital Stay: Payer: Medicaid Other | Admitting: Infectious Diseases

## 2016-01-31 ENCOUNTER — Other Ambulatory Visit: Payer: Self-pay | Admitting: Family Medicine

## 2016-02-05 ENCOUNTER — Other Ambulatory Visit: Payer: Self-pay | Admitting: Family Medicine

## 2016-02-28 ENCOUNTER — Other Ambulatory Visit: Payer: Self-pay | Admitting: Family Medicine

## 2016-03-22 ENCOUNTER — Other Ambulatory Visit (HOSPITAL_COMMUNITY)
Admission: RE | Admit: 2016-03-22 | Discharge: 2016-03-22 | Disposition: A | Discharge status: Home / Self Care | Payer: Medicare Other | Source: Ambulatory Visit | Attending: Infectious Diseases | Admitting: Infectious Diseases

## 2016-03-22 ENCOUNTER — Other Ambulatory Visit: Payer: Self-pay

## 2016-03-22 ENCOUNTER — Encounter: Payer: Self-pay | Admitting: Infectious Diseases

## 2016-03-22 ENCOUNTER — Ambulatory Visit (INDEPENDENT_AMBULATORY_CARE_PROVIDER_SITE_OTHER): Payer: Medicare Other | Admitting: Infectious Diseases

## 2016-03-22 VITALS — BP 125/89 | HR 83 | Temp 89.0°F | Ht 63.0 in | Wt 156.0 lb

## 2016-03-22 DIAGNOSIS — B20 Human immunodeficiency virus [HIV] disease: Secondary | ICD-10-CM

## 2016-03-22 DIAGNOSIS — N644 Mastodynia: Secondary | ICD-10-CM

## 2016-03-22 DIAGNOSIS — Z113 Encounter for screening for infections with a predominantly sexual mode of transmission: Secondary | ICD-10-CM | POA: Diagnosis present

## 2016-03-22 DIAGNOSIS — R251 Tremor, unspecified: Secondary | ICD-10-CM | POA: Diagnosis not present

## 2016-03-22 DIAGNOSIS — C716 Malignant neoplasm of cerebellum: Secondary | ICD-10-CM | POA: Diagnosis not present

## 2016-03-22 DIAGNOSIS — Z21 Asymptomatic human immunodeficiency virus [HIV] infection status: Secondary | ICD-10-CM

## 2016-03-22 DIAGNOSIS — Z79899 Other long term (current) drug therapy: Secondary | ICD-10-CM

## 2016-03-22 LAB — CBC
HCT: 37.1 % (ref 35.0–45.0)
HEMOGLOBIN: 11.8 g/dL (ref 11.7–15.5)
MCH: 27.9 pg (ref 27.0–33.0)
MCHC: 31.8 g/dL — ABNORMAL LOW (ref 32.0–36.0)
MCV: 87.7 fL (ref 80.0–100.0)
MPV: 9.4 fL (ref 7.5–12.5)
Platelets: 275 10*3/uL (ref 140–400)
RBC: 4.23 MIL/uL (ref 3.80–5.10)
RDW: 13.7 % (ref 11.0–15.0)
WBC: 2.8 10*3/uL — AB (ref 3.8–10.8)

## 2016-03-22 LAB — COMPREHENSIVE METABOLIC PANEL
ALBUMIN: 4 g/dL (ref 3.6–5.1)
ALT: 7 U/L (ref 6–29)
AST: 16 U/L (ref 10–30)
Alkaline Phosphatase: 68 U/L (ref 33–115)
BILIRUBIN TOTAL: 0.3 mg/dL (ref 0.2–1.2)
BUN: 11 mg/dL (ref 7–25)
CO2: 30 mmol/L (ref 20–31)
CREATININE: 0.93 mg/dL (ref 0.50–1.10)
Calcium: 9 mg/dL (ref 8.6–10.2)
Chloride: 101 mmol/L (ref 98–110)
Glucose, Bld: 102 mg/dL — ABNORMAL HIGH (ref 65–99)
Potassium: 4.6 mmol/L (ref 3.5–5.3)
SODIUM: 138 mmol/L (ref 135–146)
TOTAL PROTEIN: 6.9 g/dL (ref 6.1–8.1)

## 2016-03-22 LAB — LIPID PANEL
CHOL/HDL RATIO: 3.4 ratio (ref <5.0)
Cholesterol: 156 mg/dL (ref <200)
HDL: 46 mg/dL — ABNORMAL LOW (ref >50)
LDL Cholesterol: 81 mg/dL (ref <100)
Triglycerides: 145 mg/dL (ref <150)
VLDL: 29 mg/dL (ref <30)

## 2016-03-22 MED ORDER — ELVITEG-COBIC-EMTRICIT-TENOFAF 150-150-200-10 MG PO TABS: 1.0000 | ORAL_TABLET | Freq: Every day | ORAL | 3 refills | Status: DC

## 2016-03-22 MED ORDER — ONDANSETRON HCL 4 MG PO TABS: 4.0000 mg | ORAL_TABLET | Freq: Three times a day (TID) | ORAL | 0 refills | Status: DC | PRN

## 2016-03-22 NOTE — Progress Notes (Signed)
   Subjective:    Patient ID: Jodi Morrow, female    DOB: 1985-11-13, 30 y.o.   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  She was started on KLT/CBV. She was changed to complera at her f/u. 03-22-11  with headaches and had MRI showing meduloblastoma. She was sent to [facility] were she underwent resection 03-24-11.  She was changed to ISN/TRV due to concerns that her complera would interact with her XRT  She completed XRT Jun 15, 2011 and was changed back to complera.   She then had a genotype (12-2011) done which showed resistance to all NNRTI's.  She was then changed to stribild.  She has not been seen in ID clinic since 2014.  She was hospitalized 11-2015 and was dx with tumor progression, migraines and frequent falls. 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   Had repeat gamma-knife surgery 12-21-2015 at [facility].   Has been feeling nauseated. Having trouble with GI upset from genvoya.  Has not taken anything for nausea- tylenol. Has taken prev taken phenergan and zofran but none recently.  Her last fall was 3 days ago- has bruising on her bottom and her R elbow. She attributes this to being off balance, steeping up and over something and double vision.   HIV 1 RNA Quant (copies/mL)  Date Value  05/22/2012 27 (H)  02/27/2012 <20  01/18/2012 6,110 (H)   CD4 T Cell Abs  Date Value  12/09/2015 40 /uL (L)  05/22/2012 80 cmm (L)  02/27/2012 80 cmm (L)    Review of Systems  Constitutional: Positive for appetite change and unexpected weight change.  Eyes: Positive for visual disturbance.  Gastrointestinal: Positive for constipation. Negative for diarrhea.  Genitourinary: Negative for difficulty urinating.  Neurological: Positive for dizziness and headaches.   Wt is up 20#. Eating more to acomodate genvoya.  BM qweek.     Objective:   Physical Exam  Constitutional: She appears well-developed and well-nourished.  HENT:    Mouth/Throat: No oropharyngeal exudate.  Eyes: EOM are normal. Pupils are equal, round, and reactive to light.  Neck: Neck supple.  Cardiovascular: Normal rate, regular rhythm and normal heart sounds.   Pulmonary/Chest: Effort normal and breath sounds normal.  Abdominal: Soft. Bowel sounds are normal. There is no tenderness. There is no rebound.  Musculoskeletal: She exhibits no edema.  Lymphadenopathy:    She has no cervical adenopathy.  Skin:         Assessment & Plan:

## 2016-03-22 NOTE — Assessment & Plan Note (Signed)
In clinic she develops shaking of her R arm. She states that this happens and resovles within 20 minutes. She states it gets better when she eats. She has no dysphagia, is A x O x 3.  Will monitor.  Up ambulating.  Improved, to lab.

## 2016-03-22 NOTE — Assessment & Plan Note (Addendum)
She is doing ok.  Will recheck her labs today She has gotten flu shot Will see her back in 3 months.  Will give her zofran for her nausea.  She is on azithro but not bactrim. Hopefully can stop azithro soon Will refill her genvoya, she is nearly out.

## 2016-03-22 NOTE — Assessment & Plan Note (Signed)
She has R breast pain. Will get her mammo gram.  Maternal grandmother had "brain and breast cancer" at 23.

## 2016-03-22 NOTE — Assessment & Plan Note (Addendum)
I am very concerned about this.  I am not sure that she is safe in her current environment.  She will need f/u appt with [facility] neurosurgery.  She will call them today.

## 2016-03-23 LAB — T-HELPER CELL (CD4) - (RCID CLINIC ONLY)
CD4 % Helper T Cell: 8 % — ABNORMAL LOW (ref 33–55)
CD4 T Cell Abs: 100 /uL — ABNORMAL LOW (ref 400–2700)

## 2016-03-23 LAB — RPR

## 2016-03-23 LAB — URINE CYTOLOGY ANCILLARY ONLY
Chlamydia: NEGATIVE
Neisseria Gonorrhea: NEGATIVE

## 2016-03-27 ENCOUNTER — Other Ambulatory Visit: Payer: Medicare Other

## 2016-03-29 LAB — HIV-1 RNA QUANT-NO REFLEX-BLD
HIV 1 RNA QUANT: 302 {copies}/mL — AB
HIV-1 RNA QUANT, LOG: 2.48 {Log_copies}/mL — AB

## 2016-05-29 ENCOUNTER — Telehealth: Payer: Self-pay | Admitting: Lab

## 2016-05-29 NOTE — Telephone Encounter (Signed)
Patient was scheduled for the xray on 03/27/2016.  She NO SHOWED.  I LEFT A MESSAGE FOR HER TO CALL ME

## 2016-06-07 ENCOUNTER — Ambulatory Visit
Admission: RE | Admit: 2016-06-07 | Discharge: 2016-06-07 | Disposition: A | Discharge status: Home / Self Care | Payer: Medicare Other | Source: Ambulatory Visit | Attending: Infectious Diseases | Admitting: Infectious Diseases

## 2016-06-07 DIAGNOSIS — N644 Mastodynia: Secondary | ICD-10-CM

## 2016-06-07 HISTORY — DX: Nipple discharge: N64.52

## 2016-07-03 ENCOUNTER — Encounter: Payer: Self-pay | Admitting: Infectious Diseases

## 2016-07-03 ENCOUNTER — Other Ambulatory Visit: Payer: Self-pay | Admitting: Infectious Diseases

## 2016-07-03 ENCOUNTER — Ambulatory Visit (INDEPENDENT_AMBULATORY_CARE_PROVIDER_SITE_OTHER): Payer: Medicare Other | Admitting: Infectious Diseases

## 2016-07-03 VITALS — BP 128/78 | HR 89 | Temp 98.2°F | Ht 62.0 in | Wt 162.0 lb

## 2016-07-03 DIAGNOSIS — C716 Malignant neoplasm of cerebellum: Secondary | ICD-10-CM

## 2016-07-03 DIAGNOSIS — N644 Mastodynia: Secondary | ICD-10-CM

## 2016-07-03 DIAGNOSIS — B20 Human immunodeficiency virus [HIV] disease: Secondary | ICD-10-CM

## 2016-07-03 NOTE — Assessment & Plan Note (Signed)
Appreciate f/u at [facility].  She appears stable, await her f/u MRI.

## 2016-07-03 NOTE — Assessment & Plan Note (Signed)
Will recheck her labs today with genotype.  Offered/refused condoms.  Will set up for PAP Will give mening and pnvx Will see her back in 6 months unless her geno indicates otherwise

## 2016-07-03 NOTE — Progress Notes (Signed)
   Subjective:    Patient ID: Jodi Morrow, female    DOB: [birth year], 31 y.o.   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  She was started on KLT/CBV. She was changed to complera at her f/u. 03-22-11  with headaches and had MRI showing meduloblastoma. She was sent to [facility] were she underwent resection 03-24-11.  She was changed to ISN/TRV due to concerns that her complera would interact with her XRT  She completed XRT Jun 15, 2011 and was changed back to complera.  She then had a genotype (12-2011) done which showed resistance to all NNRTI's.  She was then changed to stribild.  She was hospitalized 11-2015 and was dx with tumor progression, migraines and frequent falls. 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  Had repeat gamma-knife surgery 12-21-2015 at [facility].   Has been taking her ART. Missed yesterday.  Has been feeling well.  Last MRI was in November 2017. She has f/u 07-2016 at [facility].     HIV 1 RNA Quant (copies/mL)  Date Value  03/22/2016 302 (H)  05/22/2012 27 (H)  02/27/2012 <20   CD4 T Cell Abs  Date Value  03/22/2016 100 /uL (L)  12/09/2015 40 /uL (L)  05/22/2012 80 cmm (L)    Review of Systems  Constitutional: Negative for appetite change and unexpected weight change.  Respiratory: Negative for cough and shortness of breath.   Gastrointestinal: Negative for constipation and diarrhea.  Genitourinary: Negative for difficulty urinating.  Neurological: Positive for headaches. Negative for seizures.  Psychiatric/Behavioral: Negative for dysphoric mood.  has been exercising, cut out "alot of carbs", sugars, egg whites.  Develops tremor in her R arm with grip.  C/o lower extrem edema. Alternating tingling behind her knees.     Objective:   Physical Exam  Constitutional: She appears well-developed and well-nourished.  HENT:  Mouth/Throat: No oropharyngeal exudate.  Eyes: EOM are normal. Pupils are equal, round,  and reactive to light.  Neck: Neck supple.  Cardiovascular: Normal rate, regular rhythm and normal heart sounds.   Pulmonary/Chest: Effort normal and breath sounds normal.  Abdominal: Soft. Bowel sounds are normal. There is no tenderness. There is no rebound.  Musculoskeletal: She exhibits no edema.  Lymphadenopathy:    She has no cervical adenopathy.  Neurological: She displays tremor.       Assessment & Plan:

## 2016-07-03 NOTE — Assessment & Plan Note (Signed)
Her mammo was negative.  Pain has resolved.

## 2016-07-05 LAB — HIV-1 RNA,QN PCR W/REFLEX GENOTYPE
HIV-1 RNA, QN PCR: 3.49 {Log_copies}/mL — AB
HIV-1 RNA, QN PCR: 3070 {copies}/mL — AB

## 2016-07-07 ENCOUNTER — Ambulatory Visit: Payer: Medicare Other

## 2016-07-08 LAB — HIV-1 INTEGRASE GENOTYPE

## 2016-07-11 LAB — HIV-1 GENOTYPR PLUS

## 2016-12-20 ENCOUNTER — Other Ambulatory Visit: Payer: Medicare Other

## 2017-01-03 ENCOUNTER — Ambulatory Visit: Payer: Medicare Other | Admitting: Infectious Diseases

## 2017-03-14 ENCOUNTER — Other Ambulatory Visit: Payer: Self-pay | Admitting: Infectious Diseases

## 2017-03-14 DIAGNOSIS — B20 Human immunodeficiency virus [HIV] disease: Secondary | ICD-10-CM

## 2017-05-30 ENCOUNTER — Emergency Department (HOSPITAL_COMMUNITY)
Admission: EM | Admit: 2017-05-30 | Discharge: 2017-05-30 | Disposition: A | Discharge status: Home / Self Care | Payer: Medicare Other | Attending: Emergency Medicine | Admitting: Emergency Medicine

## 2017-05-30 ENCOUNTER — Encounter (HOSPITAL_COMMUNITY): Payer: Self-pay | Admitting: Emergency Medicine

## 2017-05-30 ENCOUNTER — Other Ambulatory Visit: Payer: Self-pay

## 2017-05-30 DIAGNOSIS — R509 Fever, unspecified: Secondary | ICD-10-CM | POA: Insufficient documentation

## 2017-05-30 DIAGNOSIS — R1110 Vomiting, unspecified: Secondary | ICD-10-CM | POA: Insufficient documentation

## 2017-05-30 DIAGNOSIS — Z5321 Procedure and treatment not carried out due to patient leaving prior to being seen by health care provider: Secondary | ICD-10-CM | POA: Insufficient documentation

## 2017-05-30 DIAGNOSIS — R42 Dizziness and giddiness: Secondary | ICD-10-CM | POA: Diagnosis not present

## 2017-05-30 DIAGNOSIS — M7918 Myalgia, other site: Secondary | ICD-10-CM | POA: Diagnosis not present

## 2017-05-30 LAB — CBC
HEMATOCRIT: 34.9 % — AB (ref 36.0–46.0)
Hemoglobin: 11.3 g/dL — ABNORMAL LOW (ref 12.0–15.0)
MCH: 27.4 pg (ref 26.0–34.0)
MCHC: 32.4 g/dL (ref 30.0–36.0)
MCV: 84.5 fL (ref 78.0–100.0)
PLATELETS: 217 10*3/uL (ref 150–400)
RBC: 4.13 MIL/uL (ref 3.87–5.11)
RDW: 13.3 % (ref 11.5–15.5)
WBC: 7.8 10*3/uL (ref 4.0–10.5)

## 2017-05-30 LAB — I-STAT BETA HCG BLOOD, ED (MC, WL, AP ONLY): I-stat hCG, quantitative: <5 m[IU]/mL (ref <5)

## 2017-05-30 LAB — COMPREHENSIVE METABOLIC PANEL
ALT: 10 U/L — AB (ref 14–54)
AST: 19 U/L (ref 15–41)
Albumin: 3.8 g/dL (ref 3.5–5.0)
Alkaline Phosphatase: 61 U/L (ref 38–126)
Anion gap: 8 (ref 5–15)
BILIRUBIN TOTAL: 0.8 mg/dL (ref 0.3–1.2)
BUN: 9 mg/dL (ref 6–20)
CO2: 25 mmol/L (ref 22–32)
CREATININE: 0.83 mg/dL (ref 0.44–1.00)
Calcium: 8.8 mg/dL — ABNORMAL LOW (ref 8.9–10.3)
Chloride: 99 mmol/L — ABNORMAL LOW (ref 101–111)
GFR calc Af Amer: >60 mL/min (ref >60)
Glucose, Bld: 97 mg/dL (ref 65–99)
POTASSIUM: 4 mmol/L (ref 3.5–5.1)
Sodium: 132 mmol/L — ABNORMAL LOW (ref 135–145)
TOTAL PROTEIN: 7.4 g/dL (ref 6.5–8.1)

## 2017-05-30 LAB — URINALYSIS, ROUTINE W REFLEX MICROSCOPIC
BILIRUBIN URINE: NEGATIVE
GLUCOSE, UA: NEGATIVE mg/dL
Hgb urine dipstick: NEGATIVE
KETONES UR: NEGATIVE mg/dL
Leukocytes, UA: NEGATIVE
NITRITE: NEGATIVE
PH: 8 (ref 5.0–8.0)
Protein, ur: NEGATIVE mg/dL
Specific Gravity, Urine: 1.017 (ref 1.005–1.030)

## 2017-05-30 LAB — CBG MONITORING, ED: Glucose-Capillary: 103 mg/dL — ABNORMAL HIGH (ref 65–99)

## 2017-05-30 LAB — LIPASE, BLOOD: Lipase: 19 U/L (ref 11–51)

## 2017-05-30 MED ORDER — ACETAMINOPHEN 500 MG PO TABS
1000.0000 mg | ORAL_TABLET | Freq: Once | ORAL | Status: AC
  Administered 2017-05-30: 1000 mg via ORAL
  Filled 2017-05-30: qty 2

## 2017-05-30 MED ORDER — ONDANSETRON 4 MG PO TBDP
4.0000 mg | ORAL_TABLET | Freq: Once | ORAL | Status: AC | PRN
  Administered 2017-05-30: 4 mg via ORAL
  Filled 2017-05-30: qty 1

## 2017-05-30 NOTE — ED Notes (Signed)
No answer when called for vitals x2 

## 2017-05-30 NOTE — ED Notes (Signed)
No answer when called for vitals. 

## 2017-05-30 NOTE — ED Triage Notes (Signed)
Pt reports body aches, emesis, and dizziness x 2 days, febrile in triage.

## 2017-05-31 NOTE — ED Notes (Signed)
06/01/2107, 14:16  Attempted follow-up call, no answer.

## 2017-07-12 ENCOUNTER — Other Ambulatory Visit (HOSPITAL_COMMUNITY)
Admission: RE | Admit: 2017-07-12 | Discharge: 2017-07-12 | Disposition: A | Discharge status: Home / Self Care | Payer: Medicare Other | Source: Ambulatory Visit | Attending: Infectious Diseases | Admitting: Infectious Diseases

## 2017-07-12 ENCOUNTER — Other Ambulatory Visit: Payer: Medicaid Other

## 2017-07-12 DIAGNOSIS — B20 Human immunodeficiency virus [HIV] disease: Secondary | ICD-10-CM

## 2017-07-12 DIAGNOSIS — Z113 Encounter for screening for infections with a predominantly sexual mode of transmission: Secondary | ICD-10-CM | POA: Diagnosis present

## 2017-07-13 LAB — CBC WITH DIFFERENTIAL/PLATELET
BASOS ABS: 21 {cells}/uL (ref 0–200)
Basophils Relative: 0.7 %
EOS PCT: 1.7 %
Eosinophils Absolute: 51 cells/uL (ref 15–500)
HEMATOCRIT: 34.5 % — AB (ref 35.0–45.0)
HEMOGLOBIN: 11.4 g/dL — AB (ref 11.7–15.5)
LYMPHS ABS: 1011 {cells}/uL (ref 850–3900)
MCH: 27.1 pg (ref 27.0–33.0)
MCHC: 33 g/dL (ref 32.0–36.0)
MCV: 81.9 fL (ref 80.0–100.0)
MONOS PCT: 9 %
MPV: 10.2 fL (ref 7.5–12.5)
NEUTROS ABS: 1647 {cells}/uL (ref 1500–7800)
Neutrophils Relative %: 54.9 %
Platelets: 262 10*3/uL (ref 140–400)
RBC: 4.21 10*6/uL (ref 3.80–5.10)
RDW: 13.5 % (ref 11.0–15.0)
Total Lymphocyte: 33.7 %
WBC mixed population: 270 cells/uL (ref 200–950)
WBC: 3 10*3/uL — AB (ref 3.8–10.8)

## 2017-07-13 LAB — COMPLETE METABOLIC PANEL WITH GFR
AG RATIO: 1.3 (calc) (ref 1.0–2.5)
ALKALINE PHOSPHATASE (APISO): 64 U/L (ref 33–115)
ALT: 7 U/L (ref 6–29)
AST: 16 U/L (ref 10–30)
Albumin: 4.2 g/dL (ref 3.6–5.1)
BILIRUBIN TOTAL: 0.5 mg/dL (ref 0.2–1.2)
BUN: 15 mg/dL (ref 7–25)
CHLORIDE: 99 mmol/L (ref 98–110)
CO2: 28 mmol/L (ref 20–32)
Calcium: 9.5 mg/dL (ref 8.6–10.2)
Creat: 0.88 mg/dL (ref 0.50–1.10)
GFR, Est African American: 101 mL/min/{1.73_m2} (ref >=60)
GFR, Est Non African American: 88 mL/min/{1.73_m2} (ref >=60)
GLUCOSE: 78 mg/dL (ref 65–99)
Globulin: 3.2 g/dL (calc) (ref 1.9–3.7)
POTASSIUM: 4 mmol/L (ref 3.5–5.3)
Sodium: 138 mmol/L (ref 135–146)
Total Protein: 7.4 g/dL (ref 6.1–8.1)

## 2017-07-13 LAB — T-HELPER CELL (CD4) - (RCID CLINIC ONLY)
CD4 % Helper T Cell: 8 % — ABNORMAL LOW (ref 33–55)
CD4 T Cell Abs: 100 /uL — ABNORMAL LOW (ref 400–2700)

## 2017-07-13 LAB — RPR: RPR: REACTIVE — AB

## 2017-07-13 LAB — URINE CYTOLOGY ANCILLARY ONLY
CHLAMYDIA, DNA PROBE: NEGATIVE
NEISSERIA GONORRHEA: NEGATIVE

## 2017-07-13 LAB — FLUORESCENT TREPONEMAL AB(FTA)-IGG-BLD: FLUORESCENT TREPONEMAL ABS: NONREACTIVE

## 2017-07-13 LAB — RPR TITER

## 2017-07-16 LAB — HIV-1 RNA QUANT-NO REFLEX-BLD
HIV 1 RNA QUANT: 4410 {copies}/mL — AB
HIV-1 RNA Quant, Log: 3.64 Log copies/mL — ABNORMAL HIGH

## 2017-07-19 ENCOUNTER — Telehealth: Payer: Self-pay | Admitting: *Deleted

## 2017-07-19 NOTE — Telephone Encounter (Signed)
Referral received during Viral load suppression meeting at [facility]. Dr Algis Downs order is for me to begin engagement attempts with the patient and offer assistance as needed. Focus should be on addressing the patient's barrier to care and medication adherence.   Call made today with VM left stating my name and that we would like to ensure all her needs are being meet. I also stated I would like to remind her for that she has a upcoming appt on June 10th and asked for a return call or text message.

## 2017-07-23 ENCOUNTER — Ambulatory Visit (INDEPENDENT_AMBULATORY_CARE_PROVIDER_SITE_OTHER): Payer: Medicaid Other | Admitting: Licensed Clinical Social Worker

## 2017-07-23 ENCOUNTER — Encounter: Payer: Self-pay | Admitting: Infectious Diseases

## 2017-07-23 ENCOUNTER — Ambulatory Visit
Admission: RE | Admit: 2017-07-23 | Discharge: 2017-07-23 | Disposition: A | Discharge status: Home / Self Care | Payer: Medicare Other | Source: Ambulatory Visit | Attending: Infectious Diseases | Admitting: Infectious Diseases

## 2017-07-23 ENCOUNTER — Ambulatory Visit (INDEPENDENT_AMBULATORY_CARE_PROVIDER_SITE_OTHER): Payer: Medicare Other | Admitting: Infectious Diseases

## 2017-07-23 DIAGNOSIS — F331 Major depressive disorder, recurrent, moderate: Secondary | ICD-10-CM

## 2017-07-23 DIAGNOSIS — B20 Human immunodeficiency virus [HIV] disease: Secondary | ICD-10-CM

## 2017-07-23 DIAGNOSIS — C716 Malignant neoplasm of cerebellum: Secondary | ICD-10-CM

## 2017-07-23 MED ORDER — IOPAMIDOL (ISOVUE-300) INJECTION 61%
75.0000 mL | Freq: Once | INTRAVENOUS | Status: AC | PRN
  Administered 2017-07-23: 75 mL via INTRAVENOUS

## 2017-07-23 NOTE — Assessment & Plan Note (Signed)
Will recheck her labs today Check genotype rtc in 2-3 weeks.

## 2017-07-23 NOTE — Progress Notes (Signed)
   Subjective:    Patient ID: Jodi Morrow, female    DOB: 05-04-1985, 32 y.o.   MRN: [id number]  HPI 32 yo F who was found to have HIV+ at her OB visit (acquired HIV during pregnancy) 2012. She was started on KLT/CBV. She was changed to complera at her f/u. 03-22-11 with headaches and had MRI showing meduloblastoma. She was sent to [facility] were she underwent resection 03-24-11.  She was changed to ISN/TRV due to concerns that her complera would interact with her XRT  She completed XRT Jun 15, 2011 and was changed back to complera.  She then had a genotype (12-2011) done which showed resistance to all NNRTI's.  She was then changed to stribild.  She was hospitalized 11-2015 and was dx with tumor progression, migraines and frequent falls. 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  Had repeat gamma-knife surgery 12-21-2015 at [facility].  Last MRI was 02-2017:  Postsurgical changes of suboccipital craniotomy for medulloblastoma resection, with persistent but decreased nodular enhancement along the resection cavity and no new enhancing lesions identified.  Today states she hit her head last week (thurs) and has had worsened memory since. No LOC.  Missed 1 day of ART since. Genvoya.  Met with Jodi Morrow this AM Daughter is doing well (82 yo now).   HIV 1 RNA Quant (copies/mL)  Date Value  07/12/2017 4,410 (H)  03/22/2016 302 (H)  05/22/2012 27 (H)   CD4 T Cell Abs (/uL)  Date Value  07/12/2017 100 (L)  03/22/2016 100 (L)  12/09/2015 40 (L)     Review of Systems  Constitutional: Positive for appetite change. Negative for unexpected weight change.  Gastrointestinal: Negative for diarrhea and nausea.  Genitourinary: Negative for difficulty urinating.  Neurological: Negative for headaches.  Psychiatric/Behavioral: Positive for decreased concentration.  eats one meal/day.  Please see HPI. All other systems reviewed and negative.     Objective:   Physical Exam  Constitutional: She is oriented to person,  place, and time. She appears well-developed and well-nourished.  HENT:  Mouth/Throat: No oropharyngeal exudate.  Eyes: Pupils are equal, round, and reactive to light. Right eye exhibits nystagmus. Left eye exhibits nystagmus.  Neck: Normal range of motion. Neck supple.  Cardiovascular: Normal rate, regular rhythm and normal heart sounds.  Pulmonary/Chest: Effort normal and breath sounds normal.  Abdominal: Soft. Bowel sounds are normal.  Musculoskeletal: Normal range of motion. She exhibits edema.  Lymphadenopathy:    She has no cervical adenopathy.  Neurological: She is alert and oriented to person, place, and time.  Psychiatric: Her affect is blunt.       Assessment & Plan:

## 2017-07-23 NOTE — Assessment & Plan Note (Signed)
Worried about her memory change Will send her for cns imaging- spoke with radiology, will send for ct

## 2017-07-23 NOTE — BH Specialist Note (Signed)
Integrated Behavioral Health Initial Visit  MRN: [id number] Name: Jodi Morrow  Number of [place] Clinician visits:: 1/6 Session Start time: 11:00  Session End time: 11:17 Total time: 15 minutes  Type of Service: [place] Interpretor:No. Interpretor Name and Language: n/a   Warm Hand Off Completed.       SUBJECTIVE: Jodi Morrow is a 32 y.o. female accompanied by self Patient was self-referred by for depression. Patient reports the following symptoms/concerns: sadness, crying spells, stress, lack of motivation  OBJECTIVE: Mood: Anxious and Affect: Constricted Risk of harm to self or others: No plan to harm self or others  LIFE CONTEXT: Patient reports that for the last year and a half she has lived with her mother in [place] after her ex-fiance (with whom she lived in [place]) told her that God had told him he was not there to be in a relationship with her, only to help her, and she would need to move out. She indicates that she had given up her Section 8 to move in with him, and now she is struggling to get Section 8 funding back for herself so that she can live on her own. Patient states that mom is stressful, though supportive, and she misses living independently. Currently she has a 32 year old daughter with her in mom's home, but wants to move closer to [place] so she can see her two older children (ages 57 and 66) who were "legally stolen" from her by their father and who she has not seen in nearly 7 years. Patient does not work as she is disabled and receives disability income.   GOALS ADDRESSED: Patient will: 1. Reduce symptoms of: anxiety and depression 2. Increase knowledge and/or ability of: self-management skills   INTERVENTIONS: Interventions utilized: Motivational Interviewing and Supportive Counseling   ASSESSMENT: Patient currently experiencing depressed mood, anxiety/stress reactions, crying  spells, lack of motivation and energy, trouble concentrating. 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 She reports having been in therapy in the past for similar symptoms, so it is most likely Recurrent, with this episode being on Moderate severity. Counselor explained services available and guided patient to identify what she would like to see be different in her life. Patient would like to be more independent and feel better able to do things for herself. She would also like to feel less stress about her living situation and her older children.    Patient may benefit from ongoing CBT to address depressive symptoms.  PLAN: 1. Follow up with behavioral health clinician on : 07/24/17 at [facility], LCSW

## 2017-07-24 ENCOUNTER — Institutional Professional Consult (permissible substitution): Payer: Medicare Other | Admitting: Licensed Clinical Social Worker

## 2017-07-25 ENCOUNTER — Institutional Professional Consult (permissible substitution): Payer: Medicare Other | Admitting: Licensed Clinical Social Worker

## 2017-07-26 ENCOUNTER — Ambulatory Visit (INDEPENDENT_AMBULATORY_CARE_PROVIDER_SITE_OTHER): Payer: Medicaid Other | Admitting: Licensed Clinical Social Worker

## 2017-07-26 DIAGNOSIS — F331 Major depressive disorder, recurrent, moderate: Secondary | ICD-10-CM

## 2017-07-27 NOTE — BH Specialist Note (Signed)
Integrated Behavioral Health Follow Up Visit  MRN: [id number] Name: Milagro Belmares  Number of [facility] Clinician visits: 2/6 Session Start time: 4:03pm  Session End time: 4:30pm Total time: 30 minutes  Type of Service: [place] Interpretor:No. Interpretor Name and Language:  n/a  SUBJECTIVE: Keiran Sias is a 32 y.o. female accompanied by self Patient reports the following symptoms/concerns: sadness, lack of energy, thoughts that things might be hopeless   OBJECTIVE: Mood: Euthymic and Affect: Blunt Risk of harm to self or others: No plan to harm self or others  LIFE CONTEXT: Patient reports daughter has gone to stay with her father in [place] for the summer, so patient is able to do more self-care tasks without having to watch her. However, she has been feeling increasingly stressed about living with mother. She presented Counselor with paperwork for Section 8 and did not seem to understand that the paperwork needs to go to someone else. Patient reports she recently had an MRI after hitting her head, but does not feel like anything has changed or she has noticed any symptoms other than tenderness on the spot she hit.   GOALS ADDRESSED: 1. Increase knowledge and/or ability of: self-management skills  INTERVENTIONS: Interventions utilized:  Motivational Interviewing and Supportive Counseling  ASSESSMENT: Patient currently experiencing stress related to family and environment. Counselor and patient explored ways to decrease the stressors or for patient to respond differently to those she cannot change. Counselor emphasized the importance of having an idea of how she would like to respond to regular stressors, like her mother's comments, so that she can measure her progress toward them. Counselor and patient discussed what patient wants to see as the outcome, in order to determine ways she can respond.   Patient may benefit from ongoing  counseling.  PLAN: Return for follow up counseling next week.   Lillie Fragmin, LCSW

## 2017-07-28 LAB — HIV-1 RNA ULTRAQUANT REFLEX TO GENTYP+
HIV 1 RNA Quant: 35 copies/mL — ABNORMAL HIGH
HIV-1 RNA QUANT, LOG: 1.54 {Log_copies}/mL — AB

## 2017-08-08 ENCOUNTER — Telehealth: Payer: Self-pay | Admitting: *Deleted

## 2017-08-08 ENCOUNTER — Ambulatory Visit: Payer: Self-pay | Admitting: *Deleted

## 2017-08-08 ENCOUNTER — Ambulatory Visit (INDEPENDENT_AMBULATORY_CARE_PROVIDER_SITE_OTHER): Payer: Medicare Other | Admitting: Infectious Diseases

## 2017-08-08 VITALS — BP 92/65 | HR 108 | Temp 98.2°F | Wt 154.0 lb

## 2017-08-08 DIAGNOSIS — Z23 Encounter for immunization: Secondary | ICD-10-CM | POA: Diagnosis not present

## 2017-08-08 DIAGNOSIS — C716 Malignant neoplasm of cerebellum: Secondary | ICD-10-CM | POA: Diagnosis not present

## 2017-08-08 DIAGNOSIS — Z79899 Other long term (current) drug therapy: Secondary | ICD-10-CM | POA: Diagnosis not present

## 2017-08-08 DIAGNOSIS — B20 Human immunodeficiency virus [HIV] disease: Secondary | ICD-10-CM | POA: Diagnosis not present

## 2017-08-08 DIAGNOSIS — Z113 Encounter for screening for infections with a predominantly sexual mode of transmission: Secondary | ICD-10-CM | POA: Diagnosis not present

## 2017-08-08 MED ORDER — PNEUMOCOCCAL 13-VAL CONJ VACC IM SUSP
0.5000 mL | INTRAMUSCULAR | Status: AC
  Administered 2017-08-08: 0.5 mL via INTRAMUSCULAR

## 2017-08-08 NOTE — Assessment & Plan Note (Signed)
Has f/u at [facility].  Believes her next apt is in July.

## 2017-08-08 NOTE — Telephone Encounter (Signed)
Conferenced with Aria and Regina/RCID Counselor today. Annalei needs to provide the housing authority/Section 8 with her proof of income. Letonya has her Benefit Verification letter from the [employer] but she cannot remember who her case manger is.  Contacted the [employer] today but at 4:55 the office had already closed for the day. I will attempt another call tomorrow morningg

## 2017-08-08 NOTE — Assessment & Plan Note (Signed)
She is doing well Will see her back in 6 months, with labs.  Offered/refused condoms.  Will set her up for PAP Given prevnar.

## 2017-08-08 NOTE — Progress Notes (Signed)
   Subjective:    Patient ID: Jodi Morrow, female    DOB: 1985-03-26, 32 y.o.   MRN: [id number]  HPI 32yo F who was found to have HIV+ at her OB visit (acquired HIV during pregnancy) 2012. She was started on KLT/CBV. She was changed to complera at her f/u. 03-22-11 with headaches and had MRI showing meduloblastoma. She was sent to [facility] were she underwent resection 03-24-11.  She was changed to ISN/TRV due to concerns that her complera would interact with her XRT  She completed XRT Jun 15, 2011 and was changed back to complera.  She then had a genotype (12-2011) done which showed resistance to all NNRTI's.  She was then changed to stribild.  She was hospitalized 11-2015 and was dx with tumor progression, migraines and frequent falls. 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  Had repeat gamma-knife surgery 12-21-2015 at [facility].  Last MRI was 02-2017:  Postsurgical changes of suboccipital craniotomy for medulloblastoma resection, with persistent but decreased nodular enhancement along the resection cavity and no new enhancing lesions identified.  Currently on genvoya.  At her prev visit she had a fall and was sent for CT head after c/o memory changes- no acute findings (sinusitis).  Daughter is doing well (49 yo now). Lives with father in [place].  Today c/o nausea. Usually as a result of hunger.  She is back for visit after she had detectable virus (4410 at last visit). Her repeat was 35.   No recent prolonged travel.   She attributes her chills to sickle trait.   Last PAP was 2018.   Review of Systems  Constitutional: Positive for chills. Negative for appetite change, fever and unexpected weight change.  Cardiovascular: Positive for leg swelling.  Gastrointestinal: Positive for nausea. Negative for constipation and diarrhea.  Genitourinary: Negative for difficulty urinating.  Neurological: Positive for headaches.  Please see HPI. All other systems reviewed and negative.      Objective:   Physical Exam  Constitutional: She appears well-developed and well-nourished.  Eyes: Pupils are equal, round, and reactive to light. EOM are normal.  Neck: Normal range of motion. Neck supple.  Cardiovascular: Normal rate, regular rhythm and normal heart sounds.  Pulmonary/Chest: Effort normal and breath sounds normal.  Abdominal: Soft. Bowel sounds are normal. There is no tenderness. There is no guarding.  Musculoskeletal: She exhibits no edema.  Psychiatric: She has a normal mood and affect.          Assessment & Plan:

## 2017-08-08 NOTE — Addendum Note (Signed)
Addended by: Aundria Rud on: 08/08/2017 04:22 PM   Modules accepted: Orders

## 2017-08-10 ENCOUNTER — Ambulatory Visit: Payer: Self-pay | Admitting: *Deleted

## 2017-08-10 DIAGNOSIS — C716 Malignant neoplasm of cerebellum: Secondary | ICD-10-CM

## 2017-08-10 DIAGNOSIS — F331 Major depressive disorder, recurrent, moderate: Secondary | ICD-10-CM

## 2017-08-10 DIAGNOSIS — B20 Human immunodeficiency virus [HIV] disease: Secondary | ICD-10-CM

## 2017-08-13 ENCOUNTER — Ambulatory Visit: Payer: Medicare Other | Admitting: Infectious Diseases

## 2017-08-13 NOTE — Progress Notes (Signed)
Spoke with the [employer] on behalf of Ms. Jodi Morrow and the office representative stated the do need the patient's Benefit Verification Letter which verifies her income for continued housing assistance. Traveled to the [place] and left a copy of [name]'s Benefit Verification Letter with the office.

## 2017-08-13 NOTE — Progress Notes (Signed)
I have been unable to reach Jodi Morrow over the phone to f/u on a referral received.  Jodi Morrow is present at the clinic today and so I introduced myself and since we were in a open location I was sullective about what we dicussed. Jodi Morrow stated she remembered me calling and would really like help with getting her kids back. She began to discuss how the children's father took the children away from her. At this time Jodi Morrow/RCID counselor joined the conversation stating she has a Benefit Verification Letter that Jodi Morrow provided her with. Jodi Morrow stated Jodi Morrow wanted assistance with getting the letter to the Jodi Morrow but she could not remember her Case Worker's name. Jodi Morrow stated she has called the Jodi Morrow but cannot get an answer or assistance. She would like me help. I agreed to assist with getting the information(Benefit Verification Letter) to the housing Authority.

## 2018-01-16 ENCOUNTER — Other Ambulatory Visit: Payer: Medicare Other

## 2018-01-30 ENCOUNTER — Encounter: Payer: Medicare Other | Admitting: Infectious Diseases

## 2018-05-20 ENCOUNTER — Other Ambulatory Visit: Payer: Self-pay

## 2018-05-20 ENCOUNTER — Emergency Department (HOSPITAL_COMMUNITY): Payer: Medicare Other

## 2018-05-20 ENCOUNTER — Emergency Department (HOSPITAL_COMMUNITY)
Admission: EM | Admit: 2018-05-20 | Discharge: 2018-05-20 | Disposition: A | Discharge status: Other Acute Inpatient Hospital | Payer: Medicare Other | Attending: Emergency Medicine | Admitting: Emergency Medicine

## 2018-05-20 ENCOUNTER — Encounter (HOSPITAL_COMMUNITY): Payer: Self-pay | Admitting: Emergency Medicine

## 2018-05-20 DIAGNOSIS — R51 Headache: Secondary | ICD-10-CM | POA: Diagnosis not present

## 2018-05-20 DIAGNOSIS — B20 Human immunodeficiency virus [HIV] disease: Secondary | ICD-10-CM | POA: Diagnosis not present

## 2018-05-20 DIAGNOSIS — C716 Malignant neoplasm of cerebellum: Secondary | ICD-10-CM

## 2018-05-20 DIAGNOSIS — R509 Fever, unspecified: Secondary | ICD-10-CM | POA: Diagnosis present

## 2018-05-20 DIAGNOSIS — Z79899 Other long term (current) drug therapy: Secondary | ICD-10-CM | POA: Diagnosis not present

## 2018-05-20 LAB — BASIC METABOLIC PANEL
Anion gap: 7 (ref 5–15)
BUN: 6 mg/dL (ref 6–20)
CO2: 26 mmol/L (ref 22–32)
Calcium: 8.6 mg/dL — ABNORMAL LOW (ref 8.9–10.3)
Chloride: 101 mmol/L (ref 98–111)
Creatinine, Ser: 0.86 mg/dL (ref 0.44–1.00)
GFR calc Af Amer: >60 mL/min (ref >60)
GFR calc non Af Amer: >60 mL/min (ref >60)
Glucose, Bld: 111 mg/dL — ABNORMAL HIGH (ref 70–99)
Potassium: 4.3 mmol/L (ref 3.5–5.1)
Sodium: 134 mmol/L — ABNORMAL LOW (ref 135–145)

## 2018-05-20 LAB — CBC WITH DIFFERENTIAL/PLATELET
Abs Immature Granulocytes: 0.02 10*3/uL (ref 0.00–0.07)
Basophils Absolute: 0 10*3/uL (ref 0.0–0.1)
Basophils Relative: 0 %
Eosinophils Absolute: 0 10*3/uL (ref 0.0–0.5)
Eosinophils Relative: 0 %
HCT: 34.3 % — ABNORMAL LOW (ref 36.0–46.0)
Hemoglobin: 11 g/dL — ABNORMAL LOW (ref 12.0–15.0)
Immature Granulocytes: 0 %
Lymphocytes Relative: 8 %
Lymphs Abs: 0.4 10*3/uL — ABNORMAL LOW (ref 0.7–4.0)
MCH: 27.8 pg (ref 26.0–34.0)
MCHC: 32.1 g/dL (ref 30.0–36.0)
MCV: 86.8 fL (ref 80.0–100.0)
Monocytes Absolute: 0.2 10*3/uL (ref 0.1–1.0)
Monocytes Relative: 4 %
Neutro Abs: 4 10*3/uL (ref 1.7–7.7)
Neutrophils Relative %: 88 %
Platelets: 198 10*3/uL (ref 150–400)
RBC: 3.95 MIL/uL (ref 3.87–5.11)
RDW: 13.2 % (ref 11.5–15.5)
WBC: 4.6 10*3/uL (ref 4.0–10.5)
nRBC: 0 % (ref 0.0–0.2)

## 2018-05-20 LAB — I-STAT BETA HCG BLOOD, ED (MC, WL, AP ONLY): I-stat hCG, quantitative: <5 m[IU]/mL (ref <5)

## 2018-05-20 MED ORDER — PROCHLORPERAZINE EDISYLATE 10 MG/2ML IJ SOLN
10.0000 mg | Freq: Once | INTRAMUSCULAR | Status: AC
  Administered 2018-05-20: 10 mg via INTRAVENOUS
  Filled 2018-05-20: qty 2

## 2018-05-20 MED ORDER — IOHEXOL 300 MG/ML  SOLN
75.0000 mL | Freq: Once | INTRAMUSCULAR | Status: AC | PRN
  Administered 2018-05-20: 75 mL via INTRAVENOUS

## 2018-05-20 MED ORDER — DIPHENHYDRAMINE HCL 50 MG/ML IJ SOLN
12.5000 mg | Freq: Once | INTRAMUSCULAR | Status: AC
  Administered 2018-05-20: 12.5 mg via INTRAVENOUS
  Filled 2018-05-20: qty 1

## 2018-05-20 MED ORDER — DEXAMETHASONE SODIUM PHOSPHATE 10 MG/ML IJ SOLN
10.0000 mg | Freq: Once | INTRAMUSCULAR | Status: AC
  Administered 2018-05-20: 10 mg via INTRAVENOUS
  Filled 2018-05-20: qty 1

## 2018-05-20 MED ORDER — SODIUM CHLORIDE 0.9 % IV BOLUS
1000.0000 mL | Freq: Once | INTRAVENOUS | Status: AC
  Administered 2018-05-20: 1000 mL via INTRAVENOUS

## 2018-05-20 NOTE — ED Notes (Signed)
Pt attempts to use bedpan.  Squirming and pulling at lines, does not wait for (A) from nurse.  Able to calm and direct back up into appropriate bed position,.

## 2018-05-20 NOTE — ED Triage Notes (Addendum)
Per EMS: pt here for evaluation of headache (with photophobia) and fever that began a hour ago.  No cough per pt.  Pt has a HX of Brain Cancer.  EMS vitals:  BP 112/72 HR 126 RR 28 SP02 99% Temp 99.9 Oral

## 2018-05-20 NOTE — ED Notes (Addendum)
Jodi Morrow Cabin crew) viewed and approved transfer documentation

## 2018-05-20 NOTE — ED Notes (Addendum)
ED TO INPATIENT HANDOFF REPORT  ED Nurse Name and Phone #:  Michele/Cindy   S Name/Age/Gender Jodi Morrow 33 y.o. female Room/Bed: 022C/022C  Code Status   Code Status: Prior  Home/SNF/Other Home Patient oriented to: self, place, time and situation Is this baseline? Yes   Triage Complete: Triage complete  Chief Complaint sick  Triage Note Per EMS: pt here for evaluation of headache (with photophobia) and fever that began a hour ago.  No cough per pt.  Pt has a HX of Brain Cancer.  EMS vitals:  BP 112/72 HR 126 RR 28 SP02 99% Temp 99.9 Oral    Allergies Allergies  Allergen Reactions  . Penicillins Hives and Itching    Pt has tolerated rocephin in past. Has patient had a PCN reaction causing immediate rash, facial/tongue/throat swelling, SOB or lightheadedness with hypotension: Yes Has patient had a PCN reaction causing severe rash involving mucus membranes or skin necrosis: No Has patient had a PCN reaction that required hospitalization No Has patient had a PCN reaction occurring within the last 10 years: No If all of the above answers are "NO", then may proceed with Cephalosporin use.     Level of Care/Admitting Diagnosis ED Disposition    ED Disposition Condition Comment   Transfer to Another Facility  The patient appears reasonably stabilized for transfer considering the current resources, flow, and capabilities available in the ED at this time, and I doubt any other [facility] requiring further screening and/or treatment in the ED prior to transfer is p resent.       B Medical/Surgery History Past Medical History:  Diagnosis Date  . 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 Breast discharge    bilateral milky expressed x 5 years   . Chronic UTI    "since I was 33 year old"  . HIV (human immunodeficiency virus infection) ([place])     diagnosed 08/2010  . 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 Migraine   . Preterm delivery   . Pyelonephritis    1st  pregnancy  . Stumbling gait    Past Surgical History:  Procedure Laterality Date  . CESAREAN SECTION  11/23/2010   Procedure: CESAREAN SECTION;  Surgeon: Agnes Lawrence, MD;  Location: [facility];  Service: Gynecology;  Laterality: N/A;  . TONSILLECTOMY AND ADENOIDECTOMY  ~ 1998     A IV Location/Drains/Wounds Patient Lines/Drains/Airways Status   Active Line/Drains/Airways    Name:   Placement date:   Placement time:   Site:   Days:   Peripheral IV 05/20/18 Left Antecubital   05/20/18    1124    Antecubital   less than 1   Incision 11/23/10 Abdomen Other (Comment)   11/23/10    0642     2735   Incision 11/23/10 Perineum Other (Comment)   11/23/10    0642     2735          Intake/Output Last 24 hours No intake or output data in the 24 hours ending 05/20/18 1528  Labs/Imaging Results for orders placed or performed during the hospital encounter of 05/20/18 (from the past 48 hour(s))  CBC with Differential     Status: Abnormal   Collection Time: 05/20/18 11:30 AM  Result Value Ref Range   WBC 4.6 4.0 - 10.5 K/uL   RBC 3.95 3.87 - 5.11 MIL/uL   Hemoglobin 11.0 (L) 12.0 - 15.0 g/dL   HCT 34.3 (L) 36.0 - 46.0 %   MCV  86.8 80.0 - 100.0 fL   MCH 27.8 26.0 - 34.0 pg   MCHC 32.1 30.0 - 36.0 g/dL   RDW 13.2 11.5 - 15.5 %   Platelets 198 150 - 400 K/uL   nRBC 0.0 0.0 - 0.2 %   Neutrophils Relative % 88 %   Neutro Abs 4.0 1.7 - 7.7 K/uL   Lymphocytes Relative 8 %   Lymphs Abs 0.4 (L) 0.7 - 4.0 K/uL   Monocytes Relative 4 %   Monocytes Absolute 0.2 0.1 - 1.0 K/uL   Eosinophils Relative 0 %   Eosinophils Absolute 0.0 0.0 - 0.5 K/uL   Basophils Relative 0 %   Basophils Absolute 0.0 0.0 - 0.1 K/uL   Immature Granulocytes 0 %   Abs Immature Granulocytes 0.02 0.00 - 0.07 K/uL    Comment: Performed at [facility] [address], [place], [place] [address]  Basic metabolic panel     Status: Abnormal   Collection Time: 05/20/18 11:30 AM  Result Value Ref Range   Sodium  134 (L) 135 - 145 mmol/L   Potassium 4.3 3.5 - 5.1 mmol/L   Chloride 101 98 - 111 mmol/L   CO2 26 22 - 32 mmol/L   Glucose, Bld 111 (H) 70 - 99 mg/dL   BUN 6 6 - 20 mg/dL   Creatinine, Ser 0.86 0.44 - 1.00 mg/dL   Calcium 8.6 (L) 8.9 - 10.3 mg/dL   GFR calc non Af Amer >60 >60 mL/min   GFR calc Af Amer >60 >60 mL/min   Anion gap 7 5 - 15    Comment: Performed at [facility], [place] [address], [place], [place] [address]  I-Stat beta hCG blood, ED     Status: None   Collection Time: 05/20/18 11:57 AM  Result Value Ref Range   I-stat hCG, quantitative <5.0 <5 mIU/mL   Comment 3            Comment:   GEST. AGE      CONC.  (mIU/mL)   <=1 WEEK        5 - 50     2 WEEKS       50 - 500     3 WEEKS       100 - 10,000     4 WEEKS     1,000 - 30,000        FEMALE AND NON-PREGNANT FEMALE:     LESS THAN 5 mIU/mL    Ct Head W & Wo Contrast  Result Date: 05/20/2018 CLINICAL DATA:  Severe diffuse headache. History of middle lobe blastoma with surgery in 2013. EXAM: CT HEAD WITHOUT AND WITH CONTRAST TECHNIQUE: Contiguous axial images were obtained from the base of the skull through the vertex without and with intravenous contrast CONTRAST:  34mL OMNIPAQUE IOHEXOL 300 MG/ML  SOLN COMPARISON:  CT 07/23/2017.  MRI 12/09/2015. FINDINGS: Brain: Marked worsening of disease with recurrent tumor along the posterior fourth ventricle and within the substance of the cerebellum measuring 2.6 x 2.9 x 2.5 cm. Some internal necrosis. The tumor is hyperdense pre contrast. There is considerable edema throughout the cerebellum. There is mass-effect upon the fourth ventricle at the level of the tumor in there is obstructive hydrocephalus of the lateral, third and upper fourth ventricles because of this. Cerebral hemispheres appear intrinsically normal otherwise. No extra-axial collection. Vascular: No abnormal vascular finding. Skull: Previous occipital craniectomy/craniotomy. Sinuses/Orbits: Mucosal inflammatory changes  of the right maxillary sinus, right  anterior ethmoid sinuses and right frontal sinus. Orbits negative. 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 Pronounced cerebellar edema. Mass-effect upon the inferior fourth ventricle with obstructive hydrocephalus of the lateral, third and upper fourth ventricles. Neuro surgical consultation is obviously suggested. Electronically Signed   By: Nelson Chimes M.D.   On: 05/20/2018 14:27    Pending Labs Unresulted Labs (From admission, onward)   None      Vitals/Pain Today's Vitals   05/20/18 1445 05/20/18 1500 05/20/18 1521 05/20/18 1521  BP: 112/82 111/87    Pulse: 95 93    Resp:  (!) 22    Temp:      SpO2: 97% 98%    Weight:    59 kg  Height:    5\' 2"  (1.575 m)  PainSc:   2      Isolation Precautions No active isolations  Medications Medications  sodium chloride 0.9 % bolus 1,000 mL (0 mLs Intravenous Stopped 05/20/18 1327)  prochlorperazine (COMPAZINE) injection 10 mg (10 mg Intravenous Given 05/20/18 1216)  diphenhydrAMINE (BENADRYL) injection 12.5 mg (12.5 mg Intravenous Given 05/20/18 1215)  iohexol (OMNIPAQUE) 300 MG/ML solution 75 mL (75 mLs Intravenous Contrast Given 05/20/18 1417)  dexamethasone (DECADRON) injection 10 mg (10 mg Intravenous Given 05/20/18 1516)    Mobility walks with device High fall risk   Focused Assessments Neuro Assessment Handoff:  Swallow screen pass? Yes  Cardiac Rhythm: Sinus tachycardia       Neuro Assessment: Exceptions to WDL Neuro Checks:      Last Documented NIHSS Modified Score:   Has TPA been given? No If patient is a Neuro Trauma and patient is going to OR before floor call report to [facility] nurse: [phone] or [phone]     R Recommendations: See Admitting Provider Note  Report given to:   Additional Notes:   33 year old female with a past medical history  of AIDS, medulloblastoma status post resection in 2013 presents to ED for headache that began today.  Reports associated photophobia.  No improvement with Aleve.  Patient is a poor historian, appears uncomfortable and yelling/angry while answering my questions.  States that last time she had this headache she was diagnosed with brain cancer.  She is followed by neurosurgery at [facility].  No deficits neurological exam noted here.  No meningismus.  She is afebrile.  CT shows recurrent medulloblastoma, cerebellar edema, and hydrocephalus. [facility] neurosurgery asks that we consult her [facility] neurosurgery team. Dr. Leta Baptist of neurosurgery at [facility] asks that we transfer ED-ED for evaluation there. Given decadron 10mg .

## 2018-05-20 NOTE — ED Provider Notes (Signed)
[facility] EMERGENCY DEPARTMENT Provider Note   CSN: [id number] Arrival date & time: 05/20/18  1116    History   Chief Complaint Chief Complaint  Patient presents with   Fever   Headache    HPI Jodi Morrow is a 33 y.o. female with past medical history of AIDS, medulloblastoma status post resection in 2013 who presents to ED for headache that began this morning.  Reports temperature of 100.  No improvement with her Aleve.  Denies any URI symptoms, sinus pressure, injuries or falls, numbness in arms or legs, vision changes or neck pain.  Patient is a poor historian, screaming at me and groaning. No sick contacts with similar symptoms.  She reports compliance with her home medications.     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 Authorizing Provider  GENVOYA 150-150-200-10 MG TABS tablet Take 1 tablet by mouth daily with  breakfast. 03/14/17  Yes Campbell Riches, MD  ferrous sulfate 325 (65 FE) MG tablet TAKE 1 TABLET (325 MG TOTAL) BY MOUTH TWO TIMES DAILY WITH A MEAL. Patient not taking: Reported on 05/20/2018 12/15/15   Carlyle Basques, MD  SUMAtriptan (IMITREX) 100 MG tablet Take 0.5 tablets (50 mg total) by mouth as needed for migraine or headache. May repeat in 2 hours if headache persists or recurs. Patient not taking: Reported on 05/20/2018 12/11/15   Marjie Skiff, MD    Family History Family History  Problem Relation Age of Onset   Diabetes Mother    Thyroid disease Mother    Hypertension Father    Thyroid disease Father    Stroke Father    Cancer Maternal Grandmother        brain and breast   Breast cancer Maternal Grandmother     Social History Social History   Tobacco Use   Smoking status: Never Smoker   Smokeless tobacco: Never Used  Substance Use Topics   Alcohol use: No   Drug use: No     Allergies  Penicillins   Review of Systems Review of Systems  Constitutional: Negative for appetite change, chills and fever.  HENT: Negative for ear pain, rhinorrhea, sneezing and sore throat.   Eyes: Positive for photophobia. Negative for visual disturbance.  Respiratory: Negative for cough, chest tightness, shortness of breath and wheezing.   Cardiovascular: Negative for chest pain and palpitations.  Gastrointestinal: Negative for abdominal pain, blood in stool, constipation, diarrhea, nausea and vomiting.  Genitourinary: Negative for dysuria, hematuria and urgency.  Musculoskeletal: Negative for myalgias, neck pain and neck stiffness.  Skin: Negative for rash.  Neurological: Positive for headaches. Negative for dizziness, weakness and light-headedness.     Physical Exam Updated Vital Signs BP 111/87    Pulse 93    Temp 99.7 F (37.6 C)    Resp (!) 22    LMP  (LMP Unknown)    SpO2 98%   Physical Exam Vitals signs and nursing note reviewed.  Constitutional:       General: She is not in acute distress.    Appearance: She is well-developed.     Comments: Groaning, yelling.  HENT:     Head: Normocephalic and atraumatic.     Nose: Nose normal.  Eyes:     General: No scleral icterus.       Right eye: No discharge.        Left eye: No discharge.     Conjunctiva/sclera: Conjunctivae normal.     Pupils: Pupils are equal, round, and reactive to light.  Neck:     Musculoskeletal: Normal range of motion and neck supple.     Comments: No meningismus. Cardiovascular:     Rate and Rhythm: Regular rhythm. Tachycardia present.     Heart sounds: Normal heart sounds. No murmur. No friction rub. No gallop.   Pulmonary:     Effort: Pulmonary effort is normal. No respiratory distress.     Breath sounds: Normal breath sounds.  Abdominal:     General: Bowel sounds are normal. There is no distension.     Palpations: Abdomen is soft.     Tenderness: There is no abdominal tenderness. There is no guarding.  Musculoskeletal: Normal range of motion.  Skin:    General: Skin is warm and dry.     Findings: No rash.  Neurological:     General: No focal deficit present.     Mental Status: She is alert and oriented to person, place, and time.     Cranial Nerves: No cranial nerve deficit.     Sensory: No sensory deficit.     Motor: No weakness or abnormal muscle tone.     Coordination: Coordination normal.     Comments: Pupils reactive. No facial asymmetry noted. Cranial nerves appear grossly intact. Sensation intact to light touch on face, BUE and BLE. Strength 5/5 in BUE and BLE.       ED Treatments / Results  Labs (all labs ordered are listed, but only abnormal results are displayed) Labs Reviewed  CBC WITH DIFFERENTIAL/PLATELET - Abnormal; Notable for the following components:      Result Value   Hemoglobin 11.0 (*)    HCT 34.3 (*)    Lymphs Abs 0.4 (*)    All other components within normal limits  BASIC METABOLIC PANEL - Abnormal; Notable for the  following components:   Sodium 134 (*)    Glucose, Bld 111 (*)    Calcium 8.6 (*)    All other components within normal limits  I-STAT BETA HCG BLOOD,  ED ([facility], [facility], [facility] ONLY)    EKG None  Radiology Ct Head W & Wo Contrast  Result Date: 05/20/2018 CLINICAL DATA:  Severe diffuse headache. History of middle lobe blastoma with surgery in 2013. EXAM: CT HEAD WITHOUT AND WITH CONTRAST TECHNIQUE: Contiguous axial images were obtained from the base of the skull through the vertex without and with intravenous contrast CONTRAST:  82mL OMNIPAQUE IOHEXOL 300 MG/ML  SOLN COMPARISON:  CT 07/23/2017.  MRI 12/09/2015. FINDINGS: Brain: Marked worsening of disease with recurrent tumor along the posterior fourth ventricle and within the substance of the cerebellum measuring 2.6 x 2.9 x 2.5 cm. Some internal necrosis. The tumor is hyperdense pre contrast. There is considerable edema throughout the cerebellum. There is mass-effect upon the fourth ventricle at the level of the tumor in there is obstructive hydrocephalus of the lateral, third and upper fourth ventricles because of this. Cerebral hemispheres appear intrinsically normal otherwise. No extra-axial collection. Vascular: No abnormal vascular finding. Skull: Previous occipital craniectomy/craniotomy. Sinuses/Orbits: Mucosal inflammatory changes of the right maxillary sinus, right anterior ethmoid sinuses and right frontal sinus. Orbits negative. 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 Pronounced cerebellar edema. Mass-effect upon the inferior fourth ventricle with obstructive hydrocephalus of the lateral, third and upper fourth ventricles. Neuro surgical consultation is obviously suggested. Electronically Signed   By: Nelson Chimes M.D.   On: 05/20/2018 14:27    Procedures Procedures (including critical care time)  Medications Ordered  in ED Medications  sodium chloride 0.9 % bolus 1,000 mL (0 mLs Intravenous Stopped 05/20/18 1327)  prochlorperazine (COMPAZINE) injection 10 mg (10 mg Intravenous Given 05/20/18 1216)  diphenhydrAMINE (BENADRYL) injection 12.5 mg (12.5 mg Intravenous Given 05/20/18 1215)  iohexol (OMNIPAQUE) 300 MG/ML solution 75 mL (75 mLs Intravenous Contrast Given 05/20/18 1417)  dexamethasone (DECADRON) injection 10 mg (10 mg Intravenous Given 05/20/18 1516)     Initial Impression / Assessment and Plan / ED Course  I have reviewed the triage vital signs and the nursing notes.  Pertinent labs & imaging results that were available during my care of the patient were reviewed by me and considered in my medical decision making (see chart for details).        33 year old female with a past medical history of AIDS, medulloblastoma status post resection in 2013 presents to ED for headache that began today.  Reports associated photophobia.  No improvement with Aleve.  Patient is a poor historian, appears uncomfortable and yelling/angry while answering my questions.  States that last time she had this headache she was diagnosed with brain cancer.  She is followed by neurosurgery at [facility].  No deficits neurological exam noted here.  No meningismus.  She is afebrile.  CT shows recurrent medulloblastoma, cerebellar edema, and hydrocephalus.  I have consulted neurosurgery who asks that we consult her [facility] neurosurgery team. Dr. Leta Baptist of neurosurgery at [facility] asks that we transfer ED-ED for evaluation there. Given decadron 10mg .  Portions of this note were generated with [employer]. Dictation errors may occur despite best attempts at proofreading.   Final Clinical Impressions(s) / ED Diagnoses   Final diagnoses:  Medulloblastoma [facility])    ED Discharge Orders    None       Delia Heady, PA-C 05/20/18 1520    Dorie Rank, MD 05/20/18 [phone]

## 2018-05-20 NOTE — ED Notes (Signed)
Patient transported to CT 

## 2018-05-20 NOTE — ED Notes (Signed)
Pt awakened from sleep to provide bolus and pain medications,.   Lightly moaning and guarding at head,

## 2018-05-21 ENCOUNTER — Telehealth: Payer: Self-pay

## 2018-05-21 NOTE — Telephone Encounter (Signed)
Jadene Pierini from [facility] called office today stating patient is admitted with them. Rn states that patient is pending neuro surgery consult. RN also states that patient claims to be adherent with Genvoya, but will need to restart Bactrim. Rn will fax lab results for pending CD4 and vial load to our office.  Jim,Rn  P: [place], [place]

## 2018-05-21 NOTE — Telephone Encounter (Signed)
Thanks Luiz Ochoa

## 2018-05-29 MED ORDER — GENERIC EXTERNAL MEDICATION
Status: DC
Start: ? — End: 2018-05-29

## 2018-05-29 MED ORDER — INSULIN GLARGINE 100 UNIT/ML SOLOSTAR PEN
10.00 | PEN_INJECTOR | SUBCUTANEOUS | Status: DC
Start: 2018-05-30 — End: 2018-05-29

## 2018-05-29 MED ORDER — FOSPHENYTOIN SODIUM 50 MG PE/ML IJ SOLN
15.00 | INTRAMUSCULAR | Status: DC
Start: ? — End: 2018-05-29

## 2018-05-29 MED ORDER — SULFAMETHOXAZOLE-TRIMETHOPRIM 400-80 MG PO TABS
1.00 | ORAL_TABLET | ORAL | Status: DC
Start: 2018-05-30 — End: 2018-05-29

## 2018-05-29 MED ORDER — HYDROCODONE-ACETAMINOPHEN 5-325 MG PO TABS
2.00 | ORAL_TABLET | ORAL | Status: DC
Start: ? — End: 2018-05-29

## 2018-05-29 MED ORDER — HYDROCODONE-ACETAMINOPHEN 5-325 MG PO TABS
1.00 | ORAL_TABLET | ORAL | Status: DC
Start: ? — End: 2018-05-29

## 2018-05-29 MED ORDER — DIAZEPAM 5 MG PO TABS
5.00 | ORAL_TABLET | ORAL | Status: DC
Start: ? — End: 2018-05-29

## 2018-05-29 MED ORDER — FERROUS SULFATE 325 (65 FE) MG PO TABS
325.00 | ORAL_TABLET | ORAL | Status: DC
Start: 2018-05-30 — End: 2018-05-29

## 2018-05-29 MED ORDER — MUPIROCIN 2 % EX OINT
TOPICAL_OINTMENT | CUTANEOUS | Status: DC
Start: 2018-05-29 — End: 2018-05-29

## 2018-05-29 MED ORDER — ELVITEG-COBIC-EMTRICIT-TENOFAF 150-150-200-10 MG PO TABS
1.00 | ORAL_TABLET | ORAL | Status: DC
Start: 2018-05-30 — End: 2018-05-29

## 2018-05-29 MED ORDER — QUINERVA 260 MG PO TABS
650.00 | ORAL_TABLET | ORAL | Status: DC
Start: ? — End: 2018-05-29

## 2018-05-29 MED ORDER — ONDANSETRON HCL 4 MG/2ML IJ SOLN
4.00 | INTRAMUSCULAR | Status: DC
Start: ? — End: 2018-05-29

## 2018-05-29 MED ORDER — LEVETIRACETAM 500 MG PO TABS
500.00 | ORAL_TABLET | ORAL | Status: DC
Start: 2018-05-29 — End: 2018-05-29

## 2018-05-29 MED ORDER — INSULIN LISPRO 100 UNIT/ML ~~LOC~~ SOLN
3.00 | SUBCUTANEOUS | Status: DC
Start: 2018-05-29 — End: 2018-05-29

## 2018-05-29 MED ORDER — STRI-DEX MAXIMUM STRENGTH 2 % EX PADS
125.00 | MEDICATED_PAD | CUTANEOUS | Status: DC
Start: ? — End: 2018-05-29

## 2018-05-29 MED ORDER — FAMOTIDINE 20 MG PO TABS
20.00 | ORAL_TABLET | ORAL | Status: DC
Start: 2018-05-29 — End: 2018-05-29

## 2018-06-11 ENCOUNTER — Telehealth: Payer: Self-pay | Admitting: Infectious Diseases

## 2018-06-11 ENCOUNTER — Ambulatory Visit: Payer: Medicare Other | Admitting: Infectious Diseases

## 2018-06-11 NOTE — Telephone Encounter (Signed)
Tried to call pt about her appt today No answer, no VM option.

## 2019-10-10 IMAGING — CT CT HEAD WITHOUT AND WITH CONTRAST
3 of 4 series · 14 of 47 positions shown, 16 images · IV contrast (omnipaque)
Comparison: CT 07/23/2017.  MRI 12/09/2015.

CLINICAL DATA: Severe diffuse headache. History of middle lobe
blastoma with surgery in 9295.

EXAM:
CT HEAD WITHOUT AND WITH CONTRAST
TECHNIQUE: Contiguous axial images were obtained from the base of the skull
through the vertex without and with intravenous contrast
CONTRAST:  75mL OMNIPAQUE IOHEXOL 300 MG/ML  SOLN

[Series 3: head wo 5.0 h30s · axial · 0.46mm/px · z∈[-126,-6]mm · 8 of 30 slices shown, 10 images]
[im 3/30  brain]
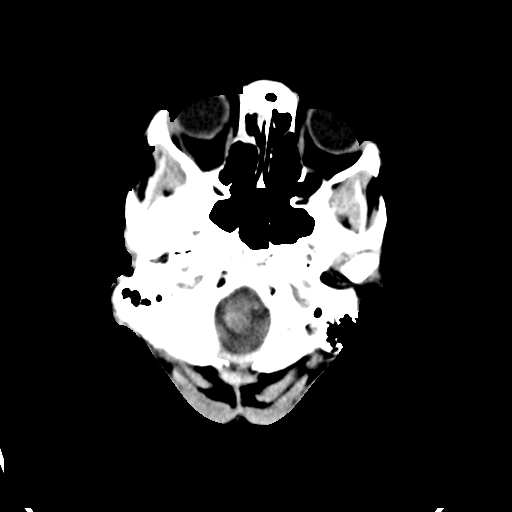
[im 3/30  bone]
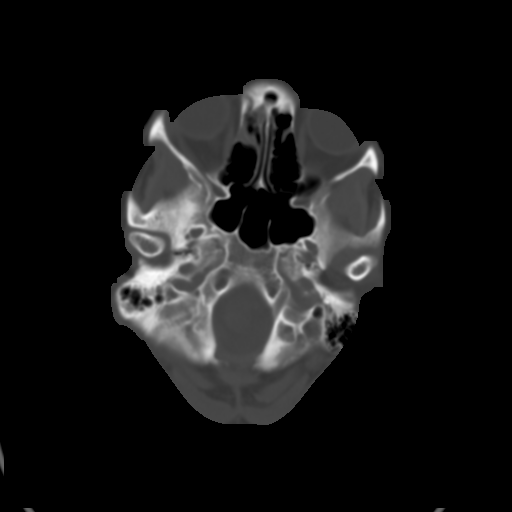
[im 7/30  brain]
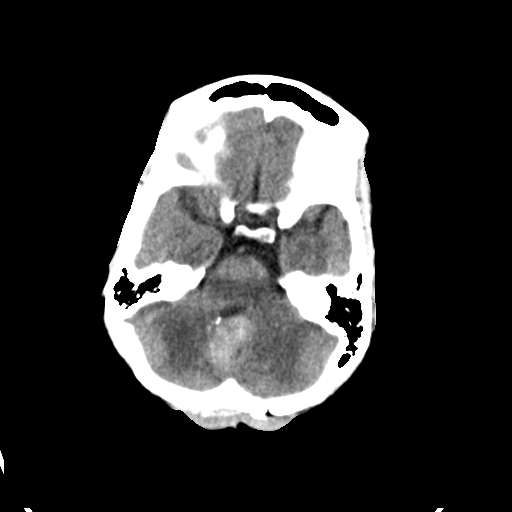
[im 11/30  brain]
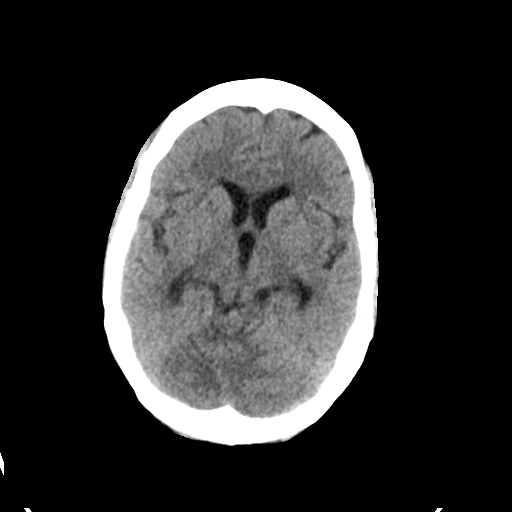
[im 13/30  brain]
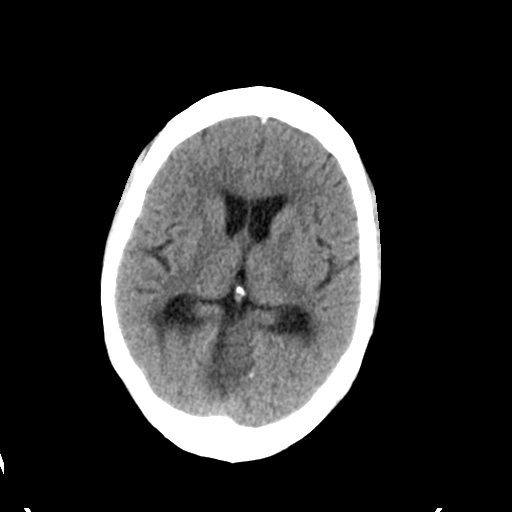
[im 17/30  brain]
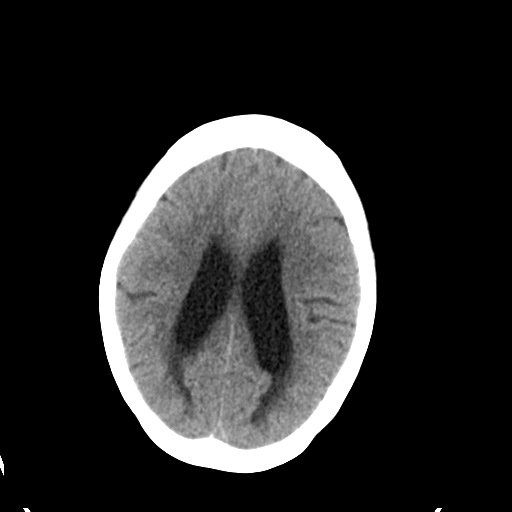
[im 17/30  bone]
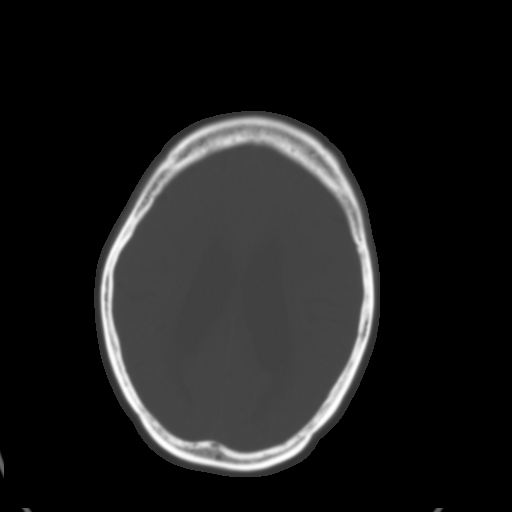
[im 19/30  brain]
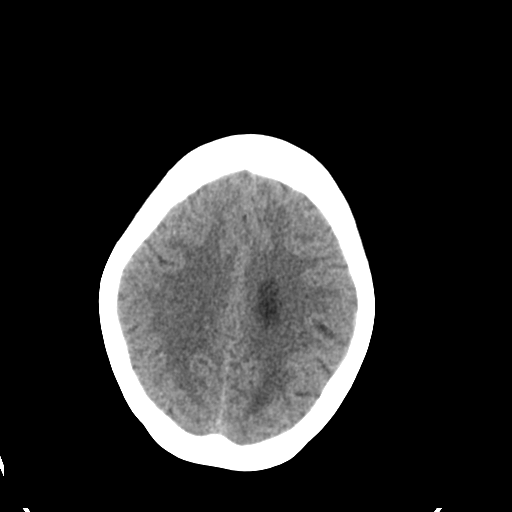
[im 23/30  brain]
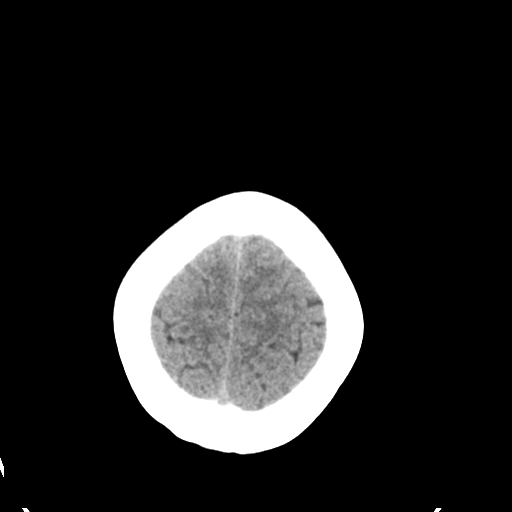
[im 27/30  brain]
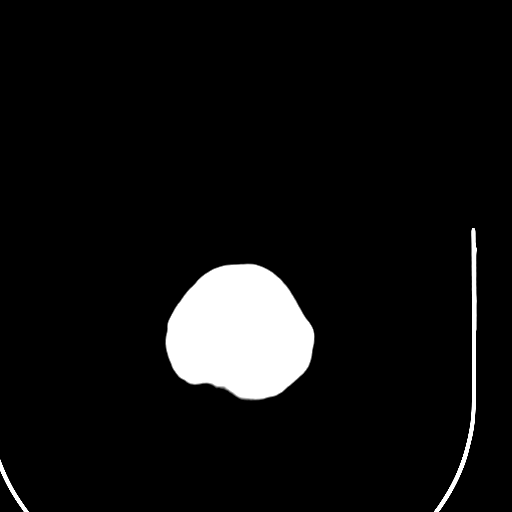

[Series 7: head with 3.0 mpr cor · coronal · 0.28mm/px · 3 of 67 slices shown]
[im 23/67  brain]
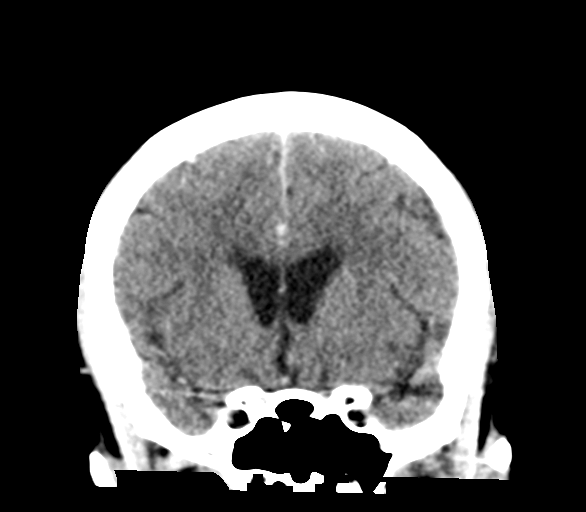
[im 30/67  brain]
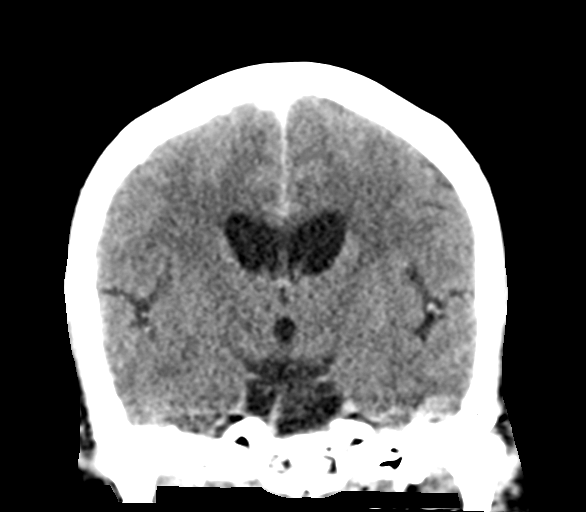
[im 37/67  brain]
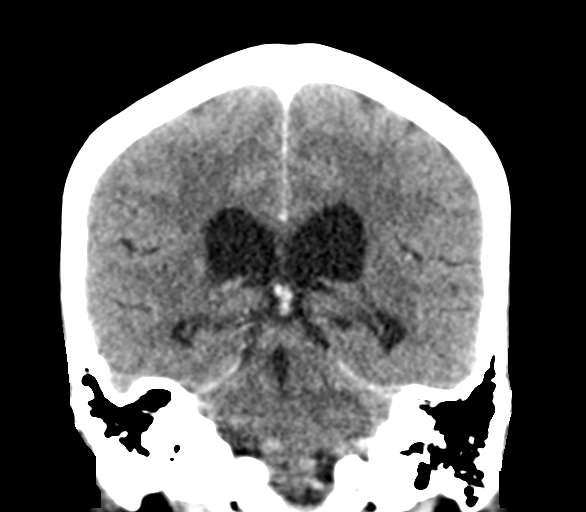

[Series 8: head with 3.0 mpr sag · sagittal · 0.32mm/px · 3 of 60 slices shown]
[im 20/60  brain]
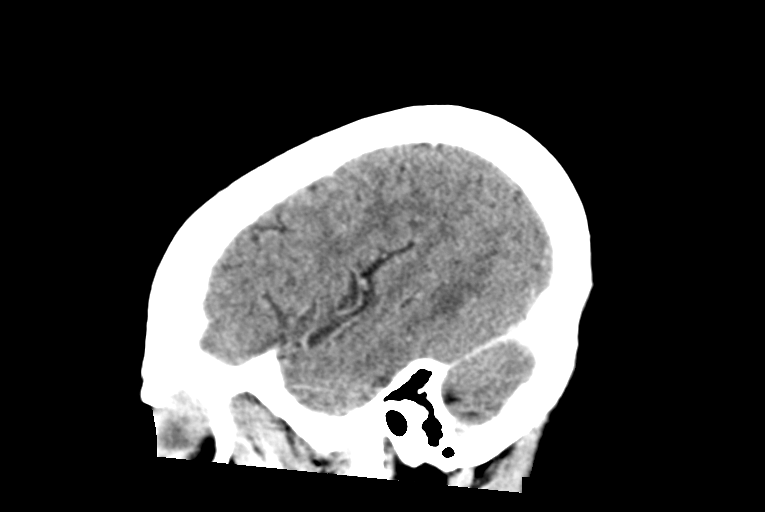
[im 30/60  brain]
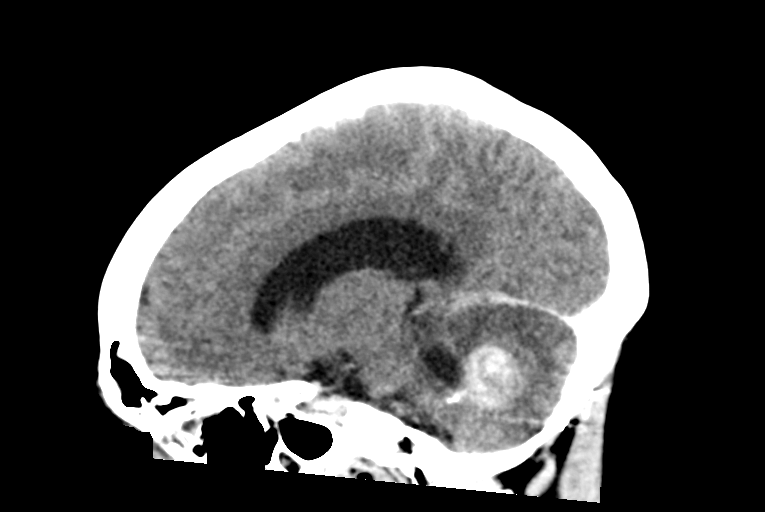
[im 40/60  brain]
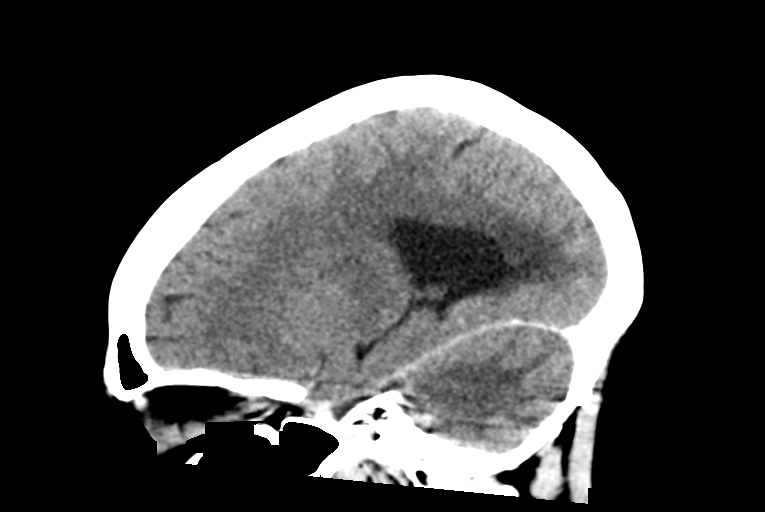

[14 of 47 positions shown; findings below may reference images not displayed]

FINDINGS: Brain: Marked worsening of disease with recurrent tumor along the
posterior fourth ventricle and within the substance of the
cerebellum measuring 2.6 x 2.9 x 2.5 cm. Some internal necrosis. The
tumor is hyperdense pre contrast. There is considerable edema
throughout the cerebellum. There is mass-effect upon the fourth
ventricle at the level of the tumor in there is obstructive
hydrocephalus of the lateral, third and upper fourth ventricles
because of this. Cerebral hemispheres appear intrinsically normal
otherwise. No extra-axial collection.

Vascular: No abnormal vascular finding.

Skull: Previous occipital craniectomy/craniotomy.

Sinuses/Orbits: Mucosal inflammatory changes of the right maxillary
sinus, right anterior ethmoid sinuses and right frontal sinus.
Orbits negative.

Other: None
IMPRESSION: Recurrent medulloblastoma along the posterior inferior fourth
ventricle and within the substance of the cerebellum, measuring
x 2.9 x 2.5 cm. Pronounced cerebellar edema. Mass-effect upon the
inferior fourth ventricle with obstructive hydrocephalus of the
lateral, third and upper fourth ventricles. Neuro surgical
consultation is obviously suggested.
